# Patient Record
Sex: Female | Born: 1945 | Race: White | Hispanic: No | Smoking: Never smoker
Health system: Southern US, Community
[De-identification: ages and names within clinical notes are randomized; demographics above are authoritative.]

## PROBLEM LIST (undated history)

## (undated) DIAGNOSIS — I10 Essential (primary) hypertension: Secondary | ICD-10-CM

## (undated) DIAGNOSIS — E7800 Pure hypercholesterolemia, unspecified: Secondary | ICD-10-CM

## (undated) HISTORY — PX: APPENDECTOMY: SHX54

## (undated) HISTORY — PX: TONSILLECTOMY: SUR1361

## (undated) HISTORY — PX: CATARACT EXTRACTION: SUR2

---

## 2013-11-25 DIAGNOSIS — G629 Polyneuropathy, unspecified: Secondary | ICD-10-CM | POA: Insufficient documentation

## 2013-11-25 HISTORY — DX: Polyneuropathy, unspecified: G62.9

## 2014-05-02 DIAGNOSIS — E782 Mixed hyperlipidemia: Secondary | ICD-10-CM

## 2014-05-02 DIAGNOSIS — J309 Allergic rhinitis, unspecified: Secondary | ICD-10-CM | POA: Insufficient documentation

## 2014-05-02 DIAGNOSIS — I10 Essential (primary) hypertension: Secondary | ICD-10-CM | POA: Insufficient documentation

## 2014-05-02 HISTORY — DX: Essential (primary) hypertension: I10

## 2014-05-02 HISTORY — DX: Allergic rhinitis, unspecified: J30.9

## 2014-05-02 HISTORY — DX: Mixed hyperlipidemia: E78.2

## 2014-05-03 DIAGNOSIS — R7303 Prediabetes: Secondary | ICD-10-CM

## 2014-05-03 DIAGNOSIS — G629 Polyneuropathy, unspecified: Secondary | ICD-10-CM

## 2014-05-03 HISTORY — DX: Polyneuropathy, unspecified: G62.9

## 2014-05-03 HISTORY — DX: Prediabetes: R73.03

## 2014-08-15 DIAGNOSIS — M8580 Other specified disorders of bone density and structure, unspecified site: Secondary | ICD-10-CM

## 2014-08-15 DIAGNOSIS — M159 Polyosteoarthritis, unspecified: Secondary | ICD-10-CM

## 2014-08-15 DIAGNOSIS — G3184 Mild cognitive impairment, so stated: Secondary | ICD-10-CM | POA: Insufficient documentation

## 2014-08-15 DIAGNOSIS — N951 Menopausal and female climacteric states: Secondary | ICD-10-CM

## 2014-08-15 HISTORY — DX: Other specified disorders of bone density and structure, unspecified site: M85.80

## 2014-08-15 HISTORY — DX: Menopausal and female climacteric states: N95.1

## 2014-08-15 HISTORY — DX: Polyosteoarthritis, unspecified: M15.9

## 2014-08-15 HISTORY — DX: Mild cognitive impairment of uncertain or unknown etiology: G31.84

## 2017-12-09 DIAGNOSIS — H2512 Age-related nuclear cataract, left eye: Secondary | ICD-10-CM | POA: Insufficient documentation

## 2017-12-09 DIAGNOSIS — H2510 Age-related nuclear cataract, unspecified eye: Secondary | ICD-10-CM | POA: Insufficient documentation

## 2017-12-09 HISTORY — DX: Age-related nuclear cataract, left eye: H25.12

## 2018-02-14 ENCOUNTER — Emergency Department (HOSPITAL_BASED_OUTPATIENT_CLINIC_OR_DEPARTMENT_OTHER)
Admission: EM | Admit: 2018-02-14 | Discharge: 2018-02-14 | Disposition: A | Discharge status: Home / Self Care | Payer: Medicare Other | Attending: Emergency Medicine | Admitting: Emergency Medicine

## 2018-02-14 ENCOUNTER — Emergency Department (HOSPITAL_BASED_OUTPATIENT_CLINIC_OR_DEPARTMENT_OTHER): Payer: Medicare Other

## 2018-02-14 ENCOUNTER — Encounter (HOSPITAL_BASED_OUTPATIENT_CLINIC_OR_DEPARTMENT_OTHER): Payer: Self-pay | Admitting: Emergency Medicine

## 2018-02-14 ENCOUNTER — Other Ambulatory Visit: Payer: Self-pay

## 2018-02-14 DIAGNOSIS — Z79899 Other long term (current) drug therapy: Secondary | ICD-10-CM | POA: Diagnosis not present

## 2018-02-14 DIAGNOSIS — I10 Essential (primary) hypertension: Secondary | ICD-10-CM | POA: Diagnosis not present

## 2018-02-14 DIAGNOSIS — M25512 Pain in left shoulder: Secondary | ICD-10-CM | POA: Insufficient documentation

## 2018-02-14 HISTORY — DX: Pure hypercholesterolemia, unspecified: E78.00

## 2018-02-14 HISTORY — DX: Essential (primary) hypertension: I10

## 2018-02-14 MED ORDER — NAPROXEN 250 MG PO TABS
250.0000 mg | ORAL_TABLET | Freq: Once | ORAL | Status: AC
  Administered 2018-02-14: 250 mg via ORAL
  Filled 2018-02-14: qty 1

## 2018-02-14 MED ORDER — HYDROCODONE-ACETAMINOPHEN 5-325 MG PO TABS: 1.0000 | ORAL_TABLET | ORAL | 0 refills | Status: DC | PRN

## 2018-02-14 MED ORDER — LIDOCAINE 5 % EX PTCH: 1.0000 | MEDICATED_PATCH | CUTANEOUS | 0 refills | Status: DC

## 2018-02-14 MED ORDER — HYDROCODONE-ACETAMINOPHEN 5-325 MG PO TABS
1.0000 | ORAL_TABLET | Freq: Once | ORAL | Status: AC
  Administered 2018-02-14: 1 via ORAL
  Filled 2018-02-14: qty 1

## 2018-02-14 MED ORDER — TRAMADOL HCL 50 MG PO TABS: 50.0000 mg | ORAL_TABLET | Freq: Once | ORAL | Status: DC

## 2018-02-14 NOTE — ED Provider Notes (Signed)
[facility] EMERGENCY DEPARTMENT Provider Note   CSN: [id number] Arrival date & time: 02/14/18  1441     History   Chief Complaint Chief Complaint  Patient presents with  . Shoulder Injury    HPI Christie Day is a 72 y.o. female.  HPI 72 year old female with past medical history significant for high cholesterol hypertension presents to the ED with pain at the left shoulder.  Patient states that she fell on her left shoulder several months ago and had imaging at that time that was normal.  Told that she probably has a rotator cuff problem.  The patient states that she has not followed up with orthopedic doctor.  States that she was improved however today she went to reach overhead to put a crockpot in the cabinet and had immediate pain in her left shoulder that almost made her drop the crockpot.  Patient states that since then she has had difficulty lifting her left arm over her shoulder.  This caused her severe pain.  States the pain is throbbing in nature.  Pain is worse with range of motion and palpation.  Nothing makes the pain better.  She did not take anything for the pain prior to arrival.  Denies any associated chest pain or shortness of breath.  Denies any associated paresthesias or weakness. Past Medical History:  Diagnosis Date  . High cholesterol   . Hypertension     There are no active problems to display for this patient.   Past Surgical History:  Procedure Laterality Date  . CATARACT EXTRACTION       OB History   None      Home Medications    Prior to Admission medications   Medication Sig Start Date End Date Taking? Authorizing Provider  amLODipine (NORVASC) 5 MG tablet Take 5 mg by mouth daily.   Yes [provider]  DULoxetine (CYMBALTA) 20 MG capsule Take 30 mg by mouth daily.    Yes [provider]  Multiple Vitamins-Minerals (MULTIVITAMIN WITH MINERALS) tablet Take 1 tablet by mouth daily.   Yes [provider]   PRESCRIPTION MEDICATION    Yes [provider]  vitamin E (VITAMIN E) 1000 UNIT capsule Take 1,000 Units by mouth daily.   Yes [provider]  HYDROcodone-acetaminophen (NORCO/VICODIN) 5-325 MG tablet Take 1-2 tablets by mouth every 4 (four) hours as needed. 02/14/18   Demetrios Loll T, PA-C  lidocaine (LIDODERM) 5 % Place 1 patch onto the skin daily. Remove & Discard patch within 12 hours or as directed by MD 02/14/18   Rise Mu, PA-C    Family History No family history on file.  Social History Social History   Tobacco Use  . Smoking status: Never Smoker  . Smokeless tobacco: Never Used  Substance Use Topics  . Alcohol use: Never    Frequency: Never  . Drug use: Never     Allergies   Sulfa antibiotics   Review of Systems Review of Systems  All other systems reviewed and are negative.    Physical Exam Updated Vital Signs BP (!) 146/75 (BP Location: Left Arm)   Pulse 72   Temp 98.4 F (36.9 C) (Oral)   Resp 18   Ht 5\' 6"  (1.676 m)   Wt 68 kg (150 lb)   SpO2 99%   BMI 24.21 kg/m   Physical Exam  Constitutional: She appears well-developed and well-nourished. No distress.  HENT:  Head: Normocephalic and atraumatic.  Eyes: Right eye exhibits  no discharge. Left eye exhibits no discharge. No scleral icterus.  Neck: Normal range of motion.  Pulmonary/Chest: No respiratory distress.  Musculoskeletal:       Left shoulder: She exhibits decreased range of motion, tenderness, bony tenderness and pain. She exhibits no swelling, no effusion, no crepitus, no deformity, no laceration, no spasm, normal pulse and normal strength.  Pain with palpation over the glenohumeral head.  Patient has a positive Neer's test.  Patient unable to tolerate Hawkins test due to the pain.  Full range of motion left elbow without pain.  Radial pulses are 2+ bilaterally.  Sensation intact.  Brisk cap refill.  Patient's not had pain over the bicep tendon groove.  Skin  compartments are soft.  The pain with palpation radiates to the left upper trapezius.  Neurological: She is alert.  Skin: No pallor.  Psychiatric: Her behavior is normal. Judgment and thought content normal.  Nursing note and vitals reviewed.    ED Treatments / Results  Labs (all labs ordered are listed, but only abnormal results are displayed) Labs Reviewed - No data to display  EKG None  Radiology Dg Shoulder Left  Result Date: 02/14/2018 CLINICAL DATA:  Left shoulder pain after overhead lifting today. Left shoulder injury in December 2018. EXAM: LEFT SHOULDER - 2+ VIEW COMPARISON:  None. FINDINGS: Mild inferior glenohumeral spur formation. Otherwise, normal appearing bones and soft tissues. IMPRESSION: Mild glenohumeral degenerative change.  No acute abnormality. Electronically Signed   By: Beckie Salts M.D.   On: 02/14/2018 15:19    Procedures Procedures (including critical care time)  Medications Ordered in ED Medications  naproxen (NAPROSYN) tablet 250 mg (has no administration in time range)  HYDROcodone-acetaminophen (NORCO/VICODIN) 5-325 MG per tablet 1 tablet (has no administration in time range)     Initial Impression / Assessment and Plan / ED Course  I have reviewed the triage vital signs and the nursing notes.  Pertinent labs & imaging results that were available during my care of the patient were reviewed by me and considered in my medical decision making (see chart for details).     Patient X-Ray negative for obvious fracture or dislocation.  Known rotator cuff abnormality left shoulder.  Acutely worsened with overhead lifting today.  Neurovascularly intact.  Pain managed in ED. Pt advised to follow up with orthopedics if symptoms persist for possibility of missed fracture diagnosis. Patient given brace while in ED, conservative therapy recommended and discussed. Patient will be dc home & is agreeable with above plan.   Pt is hemodynamically stable, in NAD, &  able to ambulate in the ED. Evaluation does not show pathology that would require ongoing emergent intervention or inpatient treatment. I explained the diagnosis to the patient. Pain has been managed & has no complaints prior to dc. Pt is comfortable with above plan and is stable for discharge at this time. All questions were answered prior to disposition. Strict return precautions for f/u to the ED were discussed. Encouraged follow up with PCP.  Pt seen and eval by my attending who is agreeable with the above plan.   Final Clinical Impressions(s) / ED Diagnoses   Final diagnoses:  Acute pain of left shoulder    ED Discharge Orders        Ordered    HYDROcodone-acetaminophen (NORCO/VICODIN) 5-325 MG tablet  Every 4 hours PRN     02/14/18 1538    lidocaine (LIDODERM) 5 %  Every 24 hours     02/14/18 1538  Rise MuLeaphart, Roniesha Hollingshead T, PA-C 02/14/18 1546    Maia PlanLong, Joshua G, MD 02/15/18 1147

## 2018-02-14 NOTE — ED Triage Notes (Addendum)
L shoulder pain after lifting arm above head to put something on a shelf. Unable to raise arm without pain.

## 2018-02-14 NOTE — Discharge Instructions (Addendum)
Your symptoms seem consistent with likely a rotator cuff abnormality.  I would recommend follow-up with an orthopedic doctor.  Have given you a short course of pain medication.  This medication make you drowsy so do not drive with it.  Recommend taking over-the-counter anti-inflammatories such as improving her Motrin or Aleve.  Return to ED if any worsening symptoms.  Would perform the stretches as discussed and avoid keeping her arm immobilized for long periods of time.

## 2018-02-16 ENCOUNTER — Ambulatory Visit: Payer: Self-pay

## 2018-02-16 ENCOUNTER — Ambulatory Visit: Payer: Medicare Other | Admitting: Family Medicine

## 2018-02-16 ENCOUNTER — Encounter: Payer: Self-pay | Admitting: Family Medicine

## 2018-02-16 VITALS — BP 132/74 | HR 72 | Ht 66.0 in | Wt 150.0 lb

## 2018-02-16 DIAGNOSIS — S4992XA Unspecified injury of left shoulder and upper arm, initial encounter: Secondary | ICD-10-CM | POA: Diagnosis not present

## 2018-02-16 DIAGNOSIS — M25512 Pain in left shoulder: Secondary | ICD-10-CM | POA: Diagnosis not present

## 2018-02-16 HISTORY — DX: Unspecified injury of left shoulder and upper arm, initial encounter: S49.92XA

## 2018-02-16 NOTE — Progress Notes (Signed)
PCP: Cheral Bay, MD  Subjective:   HPI: Patient is a 72 y.o. female here for left shoulder pain.  Patient reports back in December she slipped on ice and fell directly onto her left shoulder and hip. She improved but her shoulder never completely recovered. Then she was putting the crackpot up on 3/23 and it started to fall, she caught it with left hand and pushed it forcefully back up causing sharp pain in lateral left shoulder. Difficulty with motion since this. Pain level 5-6/10 and sharp. Pain worse at night. Tried ice/heat, norco. Taking aleve twice a day instead of her meloxicam. Right handed. No skin changes, numbness.  Past Medical History:  Diagnosis Date  . High cholesterol   . Hypertension     Current Outpatient Medications on File Prior to Visit  Medication Sig Dispense Refill  . DULoxetine (CYMBALTA) 30 MG capsule Take by mouth.    . pravastatin (PRAVACHOL) 20 MG tablet Take by mouth.    Marland Kitchen amLODipine (NORVASC) 5 MG tablet Take 5 mg by mouth daily.    Marland Kitchen HYDROcodone-acetaminophen (NORCO/VICODIN) 5-325 MG tablet Take 1-2 tablets by mouth every 4 (four) hours as needed. 8 tablet 0  . meloxicam (MOBIC) 15 MG tablet     . Multiple Vitamins-Minerals (MULTIVITAMIN WITH MINERALS) tablet Take 1 tablet by mouth daily.    . vitamin E (VITAMIN E) 1000 UNIT capsule Take 1,000 Units by mouth daily.     No current facility-administered medications on file prior to visit.     Past Surgical History:  Procedure Laterality Date  . CATARACT EXTRACTION      Allergies  Allergen Reactions  . Lisinopril Other (See Comments)    cough cough   . Sulfa Antibiotics     Social History   Socioeconomic History  . Marital status: Widowed    Spouse name: Not on file  . Number of children: Not on file  . Years of education: Not on file  . Highest education level: Not on file  Occupational History  . Not on file  Social Needs  . Financial resource strain: Not on file  .  Food insecurity:    Worry: Not on file    Inability: Not on file  . Transportation needs:    Medical: Not on file    Non-medical: Not on file  Tobacco Use  . Smoking status: Never Smoker  . Smokeless tobacco: Never Used  Substance and Sexual Activity  . Alcohol use: Never    Frequency: Never  . Drug use: Never  . Sexual activity: Not on file  Lifestyle  . Physical activity:    Days per week: Not on file    Minutes per session: Not on file  . Stress: Not on file  Relationships  . Social connections:    Talks on phone: Not on file    Gets together: Not on file    Attends religious service: Not on file    Active member of club or organization: Not on file    Attends meetings of clubs or organizations: Not on file    Relationship status: Not on file  . Intimate partner violence:    Fear of current or ex partner: Not on file    Emotionally abused: Not on file    Physically abused: Not on file    Forced sexual activity: Not on file  Other Topics Concern  . Not on file  Social History Narrative  . Not on file  History reviewed. No pertinent family history.  BP 132/74   Pulse 72   Ht 5\' 6"  (1.676 m)   Wt 150 lb (68 kg)   BMI 24.21 kg/m   Review of Systems: See HPI above.     Objective:  Physical Exam:  Gen: NAD, comfortable in exam room  Left shoulder: No swelling, ecchymoses.  No gross deformity. TTP lateral to acromion.  No other tenderness. Full IR and ER.  Only 30 degrees abduction and flexion. Positive Hawkins, Neers. Negative Yergasons. Cannot position for empty can.  Some strength with drop arm but very painful. Strength 4/5 IR, 5/5 ER. NV intact distally.  Right shoulder: No swelling, ecchymoses.  No gross deformity. No TTP. FROM. Strength 5/5 with empty can and resisted internal/external rotation. NV intact distally.   MSK u/s Left shoulder:  Biceps tendon intact on long and trans views.  Subscapularis not visible but fluid seen in its place  at insertion.  Infraspinatus is normal.  Supraspinatus with two partial tears without retraction and mild subacromial bursitis.  Assessment & Plan:  1. Left shoulder injury - independently reviewed radiographs and no evidence fracture.  Performed and reviewed ultrasound and concerning for subscapularis full thickness tear and partial tears of supraspinatus.  Will go ahead with MRI to confirm, further assess. Sling with motion exercises.  Aleve, icing.

## 2018-02-16 NOTE — Assessment & Plan Note (Signed)
independently reviewed radiographs and no evidence fracture.  Performed and reviewed ultrasound and concerning for subscapularis full thickness tear and partial tears of supraspinatus.  Will go ahead with MRI to confirm, further assess. Sling with motion exercises.  Aleve, icing.

## 2018-02-16 NOTE — Patient Instructions (Signed)
You appear to have partial tears of your supraspinatus and a full thickness tear of your subscapularis (both rotator cuff muscles). Wear your sling except twice a day when you're doing motion exercises. Arm circles, swings, table slides 3 sets of 10 once or twice a day. Aleve 2 tabs twice a day with food. Icing 15 minutes at a time 3-4 times a day. I'll call you with your MRI results and next steps.

## 2018-02-23 ENCOUNTER — Ambulatory Visit (INDEPENDENT_AMBULATORY_CARE_PROVIDER_SITE_OTHER): Payer: Medicare Other

## 2018-02-23 DIAGNOSIS — W19XXXD Unspecified fall, subsequent encounter: Secondary | ICD-10-CM | POA: Diagnosis not present

## 2018-02-23 DIAGNOSIS — S46812D Strain of other muscles, fascia and tendons at shoulder and upper arm level, left arm, subsequent encounter: Secondary | ICD-10-CM

## 2018-02-23 DIAGNOSIS — M25512 Pain in left shoulder: Secondary | ICD-10-CM

## 2018-02-23 DIAGNOSIS — M19012 Primary osteoarthritis, left shoulder: Secondary | ICD-10-CM

## 2018-02-23 DIAGNOSIS — S46012D Strain of muscle(s) and tendon(s) of the rotator cuff of left shoulder, subsequent encounter: Secondary | ICD-10-CM

## 2018-02-23 DIAGNOSIS — M25412 Effusion, left shoulder: Secondary | ICD-10-CM

## 2018-03-09 NOTE — Addendum Note (Signed)
Addended by: Kathi SimpersWISE, Hisashi Amadon F on: 03/09/2018 01:38 PM   Modules accepted: Orders

## 2019-05-03 DIAGNOSIS — M5136 Other intervertebral disc degeneration, lumbar region: Secondary | ICD-10-CM

## 2019-05-03 DIAGNOSIS — M51369 Other intervertebral disc degeneration, lumbar region without mention of lumbar back pain or lower extremity pain: Secondary | ICD-10-CM | POA: Insufficient documentation

## 2019-05-03 DIAGNOSIS — M4180 Other forms of scoliosis, site unspecified: Secondary | ICD-10-CM

## 2019-05-03 DIAGNOSIS — M4150 Other secondary scoliosis, site unspecified: Secondary | ICD-10-CM

## 2019-05-03 HISTORY — DX: Other secondary scoliosis, site unspecified: M41.50

## 2019-05-03 HISTORY — DX: Other intervertebral disc degeneration, lumbar region without mention of lumbar back pain or lower extremity pain: M51.369

## 2019-05-03 HISTORY — DX: Other forms of scoliosis, site unspecified: M41.80

## 2019-05-03 HISTORY — DX: Other intervertebral disc degeneration, lumbar region: M51.36

## 2019-07-29 DIAGNOSIS — M79671 Pain in right foot: Secondary | ICD-10-CM

## 2019-07-29 DIAGNOSIS — M79672 Pain in left foot: Secondary | ICD-10-CM | POA: Insufficient documentation

## 2019-07-29 DIAGNOSIS — M545 Low back pain, unspecified: Secondary | ICD-10-CM

## 2019-07-29 HISTORY — DX: Pain in left foot: M79.672

## 2019-07-29 HISTORY — DX: Low back pain, unspecified: M54.50

## 2019-07-29 HISTORY — DX: Pain in right foot: M79.671

## 2020-01-03 DIAGNOSIS — M19071 Primary osteoarthritis, right ankle and foot: Secondary | ICD-10-CM

## 2020-01-03 HISTORY — DX: Primary osteoarthritis, right ankle and foot: M19.071

## 2020-02-09 DIAGNOSIS — Z6824 Body mass index (BMI) 24.0-24.9, adult: Secondary | ICD-10-CM | POA: Insufficient documentation

## 2020-02-09 DIAGNOSIS — N3946 Mixed incontinence: Secondary | ICD-10-CM | POA: Insufficient documentation

## 2020-02-09 HISTORY — DX: Mixed incontinence: N39.46

## 2020-02-09 HISTORY — DX: Body mass index (BMI) 24.0-24.9, adult: Z68.24

## 2020-09-12 DIAGNOSIS — R5383 Other fatigue: Secondary | ICD-10-CM

## 2020-09-12 DIAGNOSIS — L237 Allergic contact dermatitis due to plants, except food: Secondary | ICD-10-CM

## 2020-09-12 DIAGNOSIS — R140 Abdominal distension (gaseous): Secondary | ICD-10-CM | POA: Insufficient documentation

## 2020-09-12 HISTORY — DX: Allergic contact dermatitis due to plants, except food: L23.7

## 2020-09-12 HISTORY — DX: Other fatigue: R53.83

## 2020-10-13 ENCOUNTER — Ambulatory Visit: Payer: Medicare Other | Attending: Internal Medicine

## 2020-10-13 ENCOUNTER — Other Ambulatory Visit (HOSPITAL_BASED_OUTPATIENT_CLINIC_OR_DEPARTMENT_OTHER): Payer: Self-pay | Admitting: Internal Medicine

## 2020-10-13 DIAGNOSIS — Z23 Encounter for immunization: Secondary | ICD-10-CM

## 2020-10-13 NOTE — Progress Notes (Signed)
   Covid-19 Vaccination Clinic  Name:  Glennis Borger    MRN: [id number] DOB: [birth year]  10/13/2020  Ms. Drew was observed post Covid-19 immunization for 15 minutes without incident. She was provided with Vaccine Information Sheet and instruction to access the V-Safe system.   Ms. Ra was instructed to call 911 with any severe reactions post vaccine: Marland Kitchen Difficulty breathing  . Swelling of face and throat  . A fast heartbeat  . A bad rash all over body  . Dizziness and weakness   Immunizations Administered    Name Date Dose VIS Date Route   Pfizer COVID-19 Vaccine 10/13/2020 12:32 PM 0.3 mL 09/13/2020 Intramuscular   Manufacturer: [employer], [employer]   Lot: [id number]   NDC: [address]-6

## 2021-03-20 ENCOUNTER — Emergency Department (HOSPITAL_BASED_OUTPATIENT_CLINIC_OR_DEPARTMENT_OTHER): Payer: Medicare Other

## 2021-03-20 ENCOUNTER — Other Ambulatory Visit: Payer: Self-pay

## 2021-03-20 ENCOUNTER — Emergency Department (HOSPITAL_BASED_OUTPATIENT_CLINIC_OR_DEPARTMENT_OTHER)
Admission: EM | Admit: 2021-03-20 | Discharge: 2021-03-20 | Discharge status: Against Medical Advice | Payer: Medicare Other | Attending: Emergency Medicine | Admitting: Emergency Medicine

## 2021-03-20 ENCOUNTER — Encounter (HOSPITAL_BASED_OUTPATIENT_CLINIC_OR_DEPARTMENT_OTHER): Payer: Self-pay

## 2021-03-20 DIAGNOSIS — R55 Syncope and collapse: Secondary | ICD-10-CM | POA: Diagnosis not present

## 2021-03-20 DIAGNOSIS — R002 Palpitations: Secondary | ICD-10-CM | POA: Diagnosis present

## 2021-03-20 DIAGNOSIS — R748 Abnormal levels of other serum enzymes: Secondary | ICD-10-CM | POA: Diagnosis not present

## 2021-03-20 DIAGNOSIS — R778 Other specified abnormalities of plasma proteins: Secondary | ICD-10-CM

## 2021-03-20 DIAGNOSIS — I10 Essential (primary) hypertension: Secondary | ICD-10-CM | POA: Insufficient documentation

## 2021-03-20 DIAGNOSIS — R42 Dizziness and giddiness: Secondary | ICD-10-CM | POA: Insufficient documentation

## 2021-03-20 LAB — COMPREHENSIVE METABOLIC PANEL
ALT: 22 U/L (ref 0–44)
AST: 25 U/L (ref 15–41)
Albumin: 3.8 g/dL (ref 3.5–5.0)
Alkaline Phosphatase: 87 U/L (ref 38–126)
Anion gap: 8 (ref 5–15)
BUN: 19 mg/dL (ref 8–23)
CO2: 27 mmol/L (ref 22–32)
Calcium: 9.1 mg/dL (ref 8.9–10.3)
Chloride: 103 mmol/L (ref 98–111)
Creatinine, Ser: 0.62 mg/dL (ref 0.44–1.00)
GFR, Estimated: >60 mL/min (ref >60)
Glucose, Bld: 100 mg/dL — ABNORMAL HIGH (ref 70–99)
Potassium: 3.7 mmol/L (ref 3.5–5.1)
Sodium: 138 mmol/L (ref 135–145)
Total Bilirubin: 0.4 mg/dL (ref 0.3–1.2)
Total Protein: 6.6 g/dL (ref 6.5–8.1)

## 2021-03-20 LAB — LIPASE, BLOOD: Lipase: 37 U/L (ref 11–51)

## 2021-03-20 LAB — CBC WITH DIFFERENTIAL/PLATELET
Abs Immature Granulocytes: 0.03 10*3/uL (ref 0.00–0.07)
Basophils Absolute: 0.1 10*3/uL (ref 0.0–0.1)
Basophils Relative: 2 %
Eosinophils Absolute: 0.3 10*3/uL (ref 0.0–0.5)
Eosinophils Relative: 3 %
HCT: 43.4 % (ref 36.0–46.0)
Hemoglobin: 14.3 g/dL (ref 12.0–15.0)
Immature Granulocytes: 0 %
Lymphocytes Relative: 33 %
Lymphs Abs: 2.9 10*3/uL (ref 0.7–4.0)
MCH: 30 pg (ref 26.0–34.0)
MCHC: 32.9 g/dL (ref 30.0–36.0)
MCV: 91 fL (ref 80.0–100.0)
Monocytes Absolute: 0.8 10*3/uL (ref 0.1–1.0)
Monocytes Relative: 9 %
Neutro Abs: 4.7 10*3/uL (ref 1.7–7.7)
Neutrophils Relative %: 53 %
Platelets: 207 10*3/uL (ref 150–400)
RBC: 4.77 MIL/uL (ref 3.87–5.11)
RDW: 13.1 % (ref 11.5–15.5)
WBC: 8.8 10*3/uL (ref 4.0–10.5)
nRBC: 0 % (ref 0.0–0.2)

## 2021-03-20 LAB — TROPONIN I (HIGH SENSITIVITY)
Troponin I (High Sensitivity): 14 ng/L (ref <18)
Troponin I (High Sensitivity): 26 ng/L — ABNORMAL HIGH (ref <18)
Troponin I (High Sensitivity): 44 ng/L — ABNORMAL HIGH (ref <18)

## 2021-03-20 MED ORDER — SODIUM CHLORIDE 0.9 % IV BOLUS
500.0000 mL | Freq: Once | INTRAVENOUS | Status: AC
  Administered 2021-03-20: 500 mL via INTRAVENOUS

## 2021-03-20 NOTE — ED Provider Notes (Signed)
Emergency Department Provider Note   I have reviewed the triage vital signs and the nursing notes.   HISTORY  Chief Complaint Dizziness and Palpitations (And dizziness)   HPI Christie Day is a 75 y.o. female with PMH of HTN and HLD presents to the ED with heart palpitations and lightheadedness starting this AM. The patient was out and working in the yard today for about 30 min when she became lightheaded and felt like her heart was racing. She denies pain/pressure in the chest. No full syncope. She does note that it was getting hot outside but was not working particularly hard. She worked outside in the heat most of the day yesterday but was drinking fluids and taking frequent breaks. She has had palpitations once before but the episode was brief and she did not seek care. No abdominal pain, vomiting, or diarrhea. She alerted a neighbor that she was feeling bad and ultimately called EMS. They gave 500 ml IVF en route and patient is no longer having palpitations and notes symptoms are improved.    Past Medical History:  Diagnosis Date  . Allergic rhinitis 05/02/2014  . Benign essential hypertension 05/02/2014  . BMI 24.0-24.9, adult 02/09/2020   Formatting of this note might be different from the original. Discussed normal BMI in patient - cont to do regular exercise  . Degenerative disc disease, lumbar 05/03/2019   Formatting of this note might be different from the original. Chronic, stable - cont cymbalta and gabapentin  . 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 Essential hypertension 05/02/2014   Formatting of this note might be different from the original. Chronic, well controlled, goal < 150/90 as per JNC 8, however ideally < 140/90 - cont amlodipine  . Foot pain, bilateral 07/29/2019  . High cholesterol   . Hypertension   . Low back pain 07/29/2019  . Mild cognitive impairment 08/15/2014  . Mixed hyperlipidemia 05/02/2014   Last Assessment & Plan:  Formatting of this note  might be different from the original. Chronic, stable, did not take statin - discussed ASCVD risk - patient will work on limiting saturated fats but wants repeat lipids today  . 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 Chronic, uncontrolled with conservative measures and ditropan - patient would like referral - we will start with urogynecology  . Neuropathy 2015  . Nuclear sclerotic cataract of left eye 12/09/2017  . 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 Osteoarthrosis, generalized, involving multiple sites 08/15/2014  . Osteopenia 08/15/2014  . Other fatigue 09/12/2020   Last Assessment & Plan:  Formatting of this note might be different from the original. Chronic, intermittent, no rhyme or reason - will check vitamins  . Peripheral neuropathy 05/03/2014  . Poison oak dermatitis 09/12/2020   Last Assessment & Plan:  Formatting of this note might be different from the original. Acute, uncontrolled - topical high dose clobetasol  . Prediabetes 05/03/2014  . Shoulder injury, left, initial encounter 02/16/2018  . Symptomatic menopausal or female climacteric states 08/15/2014    Patient Active Problem List   Diagnosis Date Noted  . High cholesterol   . Abdominal bloating 09/12/2020  . Other fatigue 09/12/2020  . Poison oak dermatitis 09/12/2020  . BMI 24.0-24.9, adult 02/09/2020  . 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 Foot pain, bilateral 07/29/2019  . Low back pain  07/29/2019  . Degenerative disc disease, lumbar 05/03/2019  . 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 Shoulder injury, left, initial encounter 02/16/2018  . Nuclear sclerotic cataract of left eye 12/09/2017  . Osteoarthrosis, generalized, involving multiple sites 08/15/2014  . Mild cognitive impairment 08/15/2014  . Symptomatic menopausal or female  climacteric states 08/15/2014  . Prediabetes 05/03/2014  . Peripheral neuropathy 05/03/2014  . Mixed hyperlipidemia 05/02/2014  . Benign essential hypertension 05/02/2014  . Allergic rhinitis 05/02/2014  . Essential hypertension 05/02/2014  . Neuropathy 2015    Past Surgical History:  Procedure Laterality Date  . APPENDECTOMY    . CATARACT EXTRACTION    . TONSILLECTOMY      Allergies Lisinopril and Sulfa antibiotics  No family history on file.  Social History Social History   Tobacco Use  . Smoking status: Never Smoker  . Smokeless tobacco: Never Used  Substance Use Topics  . Alcohol use: Yes    Comment: social     Review of Systems  Constitutional: No fever/chills Eyes: No visual changes. ENT: No sore throat. Cardiovascular: Denies chest pain. Positive palpitations and near syncope.   Respiratory: Denies shortness of breath. Gastrointestinal: No abdominal pain.  No nausea, no vomiting.  No diarrhea.  No constipation. Genitourinary: Negative for dysuria. Musculoskeletal: Negative for back pain. Skin: Negative for rash. Neurological: Negative for headaches, focal weakness or numbness.  10-point ROS otherwise negative.  ____________________________________________   PHYSICAL EXAM:  VITAL SIGNS: ED Triage Vitals  Enc Vitals Group     BP 03/20/21 1313 93/64     Pulse Rate 03/20/21 1309 87     Resp 03/20/21 1309 19     Temp 03/20/21 1313 98.4 F (36.9 C)     Temp Source 03/20/21 1313 Oral     SpO2 03/20/21 1309 96 %   Constitutional: Alert and oriented. Well appearing and in no acute distress. Eyes: Conjunctivae are normal.  Head: Atraumatic. Nose: No congestion/rhinnorhea. Mouth/Throat: Mucous membranes are moist.  Neck: No stridor.   Cardiovascular: Normal rate, regular rhythm. Good peripheral circulation. Grossly normal heart sounds.   Respiratory: Normal respiratory effort.  No retractions. Lungs CTAB. Gastrointestinal: Soft and nontender. No  distention.  Musculoskeletal: No lower extremity tenderness nor edema. No gross deformities of extremities. Neurologic:  Normal speech and language. No gross focal neurologic deficits are appreciated.  Skin:  Skin is warm, dry and intact. No rash noted.  ____________________________________________   LABS (all labs ordered are listed, but only abnormal results are displayed)  Labs Reviewed  COMPREHENSIVE METABOLIC PANEL - Abnormal; Notable for the following components:      Result Value   Glucose, Bld 100 (*)    All other components within normal limits  TROPONIN I (HIGH SENSITIVITY) - Abnormal; Notable for the following components:   Troponin I (High Sensitivity) 26 (*)    All other components within normal limits  TROPONIN I (HIGH SENSITIVITY) - Abnormal; Notable for the following components:   Troponin I (High Sensitivity) 44 (*)    All other components within normal limits  LIPASE, BLOOD  CBC WITH DIFFERENTIAL/PLATELET  TROPONIN I (HIGH SENSITIVITY)   ____________________________________________  EKG   EKG Interpretation  Date/Time:  Tuesday March 20 2021 13:09:17 EDT Ventricular Rate:  87 PR Interval:  163 QRS Duration: 92 QT Interval:  358 QTC Calculation: 431 R Axis:   35 Text Interpretation: Sinus rhythm Consider left atrial enlargement Low voltage, precordial leads RSR' in V1 or V2, probably normal variant No old tracing  to compare Confirmed by Alona Bene [phone]) on 03/20/2021 1:13:31 PM       ____________________________________________  RADIOLOGY  CXR reviewed  ____________________________________________   PROCEDURES  Procedure(s) performed:   Procedures  None ____________________________________________   INITIAL IMPRESSION / ASSESSMENT AND PLAN / ED COURSE  Pertinent labs & imaging results that were available during my care of the patient were reviewed by me and considered in my medical decision making (see chart for details).   Patient  presents to the emergency department valuation heart palpitations and near syncope while working in the yard today.  She is feeling improved after some IV fluids.  She is afebrile with normal rate and sinus rhythm on EKG.  EKG interpreted by me as above.  Her blood pressures on arrival are somewhat soft with systolic pressures in the 90s.  It is warm outside today but not overly hot.  As she is awake and alert.  I do not appreciate any focal neurologic deficits or global encephalopathy type presentation.   Labs pending. Care transferred to Dr. Stevie Kern.  ____________________________________________  FINAL CLINICAL IMPRESSION(S) / ED DIAGNOSES  Final diagnoses:  Palpitations  Near syncope  Elevated troponin     MEDICATIONS GIVEN DURING THIS VISIT:  Medications  sodium chloride 0.9 % bolus 500 mL (0 mLs Intravenous Stopped 03/20/21 1528)     Note:  This document was prepared using Dragon voice recognition software and may include unintentional dictation errors.  Alona Bene, MD, [facility] Emergency Medicine    Sharel Behne, Arlyss Repress, MD 03/23/21 [phone]

## 2021-03-20 NOTE — Discharge Instructions (Addendum)
Your troponin level was mildly elevated.  We recommended admission for further management.  You may return at anytime if you change your mind, or if you have any recurrence of your symptoms.  I have placed a referral in our system to the Cardiology team. Call tomorrow to confirm your appointment.   Please call your regular doctor as soon as possible to schedule the next available clinic appointment to follow up with him/her regarding your visit to the ED and your symptoms.  Return to the Emergency Department (ED)  if you have any further syncopal episodes (pass out again) or develop ANY chest pain, pressure, tightness, trouble breathing, sudden sweating, or other symptoms that concern you.

## 2021-03-20 NOTE — ED Triage Notes (Signed)
Arrived ems w c/o dizziness, hot, clammy while being outside

## 2021-03-20 NOTE — ED Triage Notes (Signed)
Pt was working in yard this am, became very dizzy and heart racing.  Pt called EMS and was transported to ED for evaluation

## 2021-03-20 NOTE — ED Provider Notes (Signed)
Signout note  Patient had an episode of lightheadedness and palpitations while working in the yard this morning.  Initial work-up was overall reassuring.  Second troponin ordered to rule out ACS.  3:20 PM Received signout from Dr. Jacqulyn Bath, follow-up on repeat troponin, anticipate discharge  4:30 PM Repeat troponin was slightly elevated at 26, reassessed patient, she has no ongoing symptoms, will send a third troponin to further evaluate and continue to monitor patient on monitor  6:13 PM 3rd trop 44, will discuss with cards  6:48 PM I discussed case with cardiology, Dr. Mayford Knife, feels unlikely ACS but raises concern for possible arrhythmia given this mild elevation and palpitations/near syncope, she recommended recommended admission to the hospitalist service for further observation, telemetry monitoring overnight; I reassessed patient, discussed the abnormal blood work and the cardiology recommendation for admission.  Patient states that she has no ongoing symptoms and wishes to pursue outpatient management at this time.  I discussed the risks and benefits in detail, she acknowledged these and has chose to leave AGAINST MEDICAL ADVICE.   Milagros Loll, MD 03/20/21 [phone]

## 2021-03-21 ENCOUNTER — Other Ambulatory Visit: Payer: Self-pay

## 2021-03-21 DIAGNOSIS — M159 Polyosteoarthritis, unspecified: Secondary | ICD-10-CM

## 2021-03-21 DIAGNOSIS — N951 Menopausal and female climacteric states: Secondary | ICD-10-CM

## 2021-03-21 DIAGNOSIS — E7800 Pure hypercholesterolemia, unspecified: Secondary | ICD-10-CM | POA: Insufficient documentation

## 2021-03-23 ENCOUNTER — Ambulatory Visit: Payer: Medicare Other | Admitting: Cardiology

## 2021-03-23 ENCOUNTER — Encounter: Payer: Self-pay | Admitting: Cardiology

## 2021-03-23 ENCOUNTER — Other Ambulatory Visit: Payer: Self-pay

## 2021-03-23 VITALS — BP 112/80 | HR 69 | Ht 66.0 in | Wt 147.0 lb

## 2021-03-23 DIAGNOSIS — R7303 Prediabetes: Secondary | ICD-10-CM

## 2021-03-23 DIAGNOSIS — I10 Essential (primary) hypertension: Secondary | ICD-10-CM | POA: Diagnosis not present

## 2021-03-23 DIAGNOSIS — E7800 Pure hypercholesterolemia, unspecified: Secondary | ICD-10-CM | POA: Diagnosis not present

## 2021-03-23 DIAGNOSIS — R0789 Other chest pain: Secondary | ICD-10-CM

## 2021-03-23 DIAGNOSIS — R079 Chest pain, unspecified: Secondary | ICD-10-CM

## 2021-03-23 MED ORDER — NITROGLYCERIN 0.4 MG SL SUBL: 0.4000 mg | SUBLINGUAL_TABLET | SUBLINGUAL | 11 refills | Status: AC | PRN

## 2021-03-23 MED ORDER — METOPROLOL TARTRATE 100 MG PO TABS: 100.0000 mg | ORAL_TABLET | Freq: Once | ORAL | 0 refills | Status: AC

## 2021-03-23 MED ORDER — ATORVASTATIN CALCIUM 10 MG PO TABS: 10.0000 mg | ORAL_TABLET | Freq: Every day | ORAL | 1 refills | Status: DC

## 2021-03-23 NOTE — Progress Notes (Signed)
Cardiology Consultation:    Date:  03/23/2021   ID:  Christie Day, DOB 01/26/1946, MRN [id number]  PCP:  Herma Carson, MD  Cardiologist:  Gypsy Balsam, MD   Referring MD: Maia Plan, MD   Chief Complaint  Patient presents with  . Palpitations  . Dehydration  . Dizziness    History of Present Illness:    Christie Day is a 75 y.o. female who is being seen today for the evaluation of I had dizziness palpitations and some atypical chest pain at the request of Long, Arlyss Repress, MD.  She does have history of hypertension, dyslipidemia, borderline diabetes.  She is very active she exercise on the regular basis she can walk and climb stairs with no difficulties.  Couple days ago she was washing hands with her neighbor that was in the middle of the day it was quite hot day after that she started feeling poorly dizzy started having some pounding in the chest and also some uneasy sensation of the chest eventually not going home call her friend who is a Engineer, civil (consulting) and she advised her to go to the emergency room.  In the emergency room quite extensive evaluation has been done.  EKG did not show any acute changes however troponin I was minimally elevated with a trend of down.  Highest number of high-sensitivity troponin I was 44.  The troponin was repeated yesterday and was normal.  Since that time she has absolutely no problem she can do whatever she wants to do with no difficulties.  Interestingly she describes similar episodes in November and not as profound.  Again overall she is very active she has no difficulty walking climbing stairs and no problem doing this she goes and exercise on the regular basis with no difficulties. She does not smoke Does not have family history of premature coronary disease but her husband died because of heart attack about 10 years ago. She does exercise on the regular basis with no difficulties  Past Medical History:  Diagnosis Date  . Allergic rhinitis  05/02/2014  . Benign essential hypertension 05/02/2014  . BMI 24.0-24.9, adult 02/09/2020   Formatting of this note might be different from the original. Discussed normal BMI in patient - cont to do regular exercise  . Degenerative disc disease, lumbar 05/03/2019   Formatting of this note might be different from the original. Chronic, stable - cont cymbalta and gabapentin  . 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 Essential hypertension 05/02/2014   Formatting of this note might be different from the original. Chronic, well controlled, goal < 150/90 as per JNC 8, however ideally < 140/90 - cont amlodipine  . Foot pain, bilateral 07/29/2019  . High cholesterol   . Hypertension   . Low back pain 07/29/2019  . Mild cognitive impairment 08/15/2014  . Mixed hyperlipidemia 05/02/2014   Last Assessment & Plan:  Formatting of this note might be different from the original. Chronic, stable, did not take statin - discussed ASCVD risk - patient will work on limiting saturated fats but wants repeat lipids today  . 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 Chronic, uncontrolled with conservative measures and ditropan - patient would like referral - we will start with urogynecology  . Neuropathy 2015  . Nuclear sclerotic cataract of left eye 12/09/2017  . 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 Osteoarthrosis, generalized, involving multiple sites  08/15/2014  . Osteopenia 08/15/2014  . Other fatigue 09/12/2020   Last Assessment & Plan:  Formatting of this note might be different from the original. Chronic, intermittent, no rhyme or reason - will check vitamins  . Peripheral neuropathy 05/03/2014  . Poison oak dermatitis 09/12/2020   Last Assessment & Plan:  Formatting of this note might be different from the original. Acute, uncontrolled - topical high dose clobetasol  . Prediabetes 05/03/2014  . Shoulder  injury, left, initial encounter 02/16/2018  . Symptomatic menopausal or female climacteric states 08/15/2014    Past Surgical History:  Procedure Laterality Date  . APPENDECTOMY    . CATARACT EXTRACTION    . TONSILLECTOMY      Current Medications: Current Meds  Medication Sig  . amLODipine (NORVASC) 5 MG tablet Take 5 mg by mouth daily.  Marland Kitchen atorvastatin (LIPITOR) 10 MG tablet Take 1 tablet (10 mg total) by mouth daily.  . B Complex-C (SUPER B COMPLEX PO) Take 1 tablet by mouth daily. Unknown strength  . Calcium Acetate, Phos Binder, (CALCIUM ACETATE PO) Take 1 tablet by mouth daily. Unknown strength  . cholecalciferol (VITAMIN D3) 25 MCG (1000 UNIT) tablet Take 1,000 Units by mouth daily.  . DULoxetine (CYMBALTA) 30 MG capsule Take 60 mg by mouth daily.  . fexofenadine-pseudoephedrine (ALLEGRA-D) 60-120 MG 12 hr tablet Take 1 tablet by mouth as needed (Seasonal allergies).  . metoprolol tartrate (LOPRESSOR) 100 MG tablet Take 1 tablet (100 mg total) by mouth once for 1 dose. 2 hours before ct.  . Multiple Vitamin (MULTI-VITAMIN) tablet Take 1 tablet by mouth daily. Unknown strength  . naproxen (NAPROSYN) 500 MG tablet Take 1 tablet by mouth as needed (feet arthritis, neuropathy).  . nitroGLYCERIN (NITROSTAT) 0.4 MG SL tablet Place 1 tablet (0.4 mg total) under the tongue every 5 (five) minutes as needed.  Marland Kitchen oxybutynin (DITROPAN-XL) 5 MG 24 hr tablet Take 1 tablet by mouth at bedtime.  . pyridoxine (B-6) 100 MG tablet Take 100 mg by mouth daily.  . Turmeric (QC TUMERIC COMPLEX PO) Take 1 tablet by mouth daily. Unknown strength  . [DISCONTINUED] Multiple Vitamins-Minerals (MULTIVITAMIN WITH MINERALS) tablet Take 1 tablet by mouth daily.     Allergies:   Lisinopril and Sulfa antibiotics   Social History   Socioeconomic History  . Marital status: Widowed    Spouse name: Not on file  . Number of children: Not on file  . Years of education: Not on file  . Highest education level: Not  on file  Occupational History  . Not on file  Tobacco Use  . Smoking status: Never Smoker  . Smokeless tobacco: Never Used  Substance and Sexual Activity  . Alcohol use: Yes    Comment: social   . Drug use: Not on file  . Sexual activity: Not on file  Other Topics Concern  . Not on file  Social History Narrative  . Not on file   Social Determinants of Health   Financial Resource Strain: Not on file  Food Insecurity: Not on file  Transportation Needs: Not on file  Physical Activity: Not on file  Stress: Not on file  Social Connections: Not on file     Family History: The patient's family history includes Asthma in her mother; Diabetes in her mother; Emphysema in her father. ROS:   Please see the history of present illness.    All 14 point review of systems negative except as described per history of present illness.  EKGs/Labs/Other  Studies Reviewed:    The following studies were reviewed today: I did review record from the emergency room  EKG:  EKG is  ordered today.  The ekg ordered today demonstrates normal sinus rhythm normal P interval no ST segment changes  Recent Labs: 03/20/2021: ALT 22; BUN 19; Creatinine, Ser 0.62; Hemoglobin 14.3; Platelets 207; Potassium 3.7; Sodium 138  Recent Lipid Panel No results found for: CHOL, TRIG, HDL, CHOLHDL, VLDL, LDLCALC, LDLDIRECT  Physical Exam:    VS:  BP 112/80 (BP Location: Right Arm, Patient Position: Sitting)   Pulse 69   Ht 5\' 6"  (1.676 m)   Wt 147 lb (66.7 kg)   SpO2 95%   BMI 23.73 kg/m     Wt Readings from Last 3 Encounters:  03/23/21 147 lb (66.7 kg)  03/20/21 145 lb (65.8 kg)  02/16/18 150 lb (68 kg)     GEN:  Well nourished, well developed in no acute distress HEENT: Normal NECK: No JVD; No carotid bruits LYMPHATICS: No lymphadenopathy CARDIAC: RRR, no murmurs, no rubs, no gallops RESPIRATORY:  Clear to auscultation without rales, wheezing or rhonchi  ABDOMEN: Soft, non-tender,  non-distended MUSCULOSKELETAL:  No edema; No deformity  SKIN: Warm and dry NEUROLOGIC:  Alert and oriented x 3 PSYCHIATRIC:  Normal affect   ASSESSMENT:    1. Chest pain of uncertain etiology   2. Benign essential hypertension   3. Prediabetes   4. High cholesterol   5. Atypical chest pain    PLAN:    In order of problems listed above:  1. Chest pain uncertain etiology.  Troponin I minimally elevated however this is high-sensitivity of troponin I.  Could be elevated because of dehydration because of palpitations because of arrhythmia.  Since that time she has absolutely no symptomatology.  Overall she does not have any signs and symptoms suggestive coronary artery disease before.  I still think it would be reasonable to make sure she does not have obstructive disease.  I will schedule her to have coronary CT angio.  I will also ask her to start taking 1 baby aspirin every single day, will also initiate statin therapy and I will give him nitroglycerin.  With instruction to go to the emergency room or call 911 if 3 nitroglycerin does not relieve the pain. 2. Benign essential hypertension blood pressure seems to well controlled continue present management. 3. Prediabetes we will continue discussion about proper diet and need to exercise but first we need to answer the question if she have significant heart problem. 4. Dyslipidemia I asked her to start taking Lipitor 10 mg daily.  Future recommendation in terms of dosages of those medications as well as aggressiveness of treatment will depend on coronary CT angio.    Medication Adjustments/Labs and Tests Ordered: Current medicines are reviewed at length with the patient today.  Concerns regarding medicines are outlined above.  Orders Placed This Encounter  Procedures  . CT CORONARY MORPH W/CTA COR W/SCORE W/CA W/CM &/OR WO/CM  . Basic metabolic panel  . EKG 12-Lead   Meds ordered this encounter  Medications  . nitroGLYCERIN (NITROSTAT)  0.4 MG SL tablet    Sig: Place 1 tablet (0.4 mg total) under the tongue every 5 (five) minutes as needed.    Dispense:  25 tablet    Refill:  11  . atorvastatin (LIPITOR) 10 MG tablet    Sig: Take 1 tablet (10 mg total) by mouth daily.    Dispense:  90 tablet  Refill:  1  . metoprolol tartrate (LOPRESSOR) 100 MG tablet    Sig: Take 1 tablet (100 mg total) by mouth once for 1 dose. 2 hours before ct.    Dispense:  1 tablet    Refill:  0    Signed, Georgeanna Lea, MD, [facility]. 03/23/2021 12:54 PM    [facility]

## 2021-03-23 NOTE — Patient Instructions (Signed)
Medication Instructions:  Your physician has recommended you make the following change in your medication:   TAKE AS NEEDED FOR CHEST PAIN: Nitroglycerin 0.4 mg sublingual (under your tongue) as needed for chest pain. If experiencing chest pain, stop what you are doing and sit down. Take 1 nitroglycerin and wait 5 minutes. If chest pain continues, take another nitroglycerin and wait 5 minutes. If chest pain does not subside, take 1 more nitroglycerin and dial 911. You make take a total of 3 nitroglycerin in a 15 minute time frame.  START: Lipitor 10 mg daily   *If you need a refill on your cardiac medications before your next appointment, please call your pharmacy*   Lab Work: Your physician recommends that you return for lab work 3-7 days before ct : BMP   If you have labs (blood work) drawn today and your tests are completely normal, you will receive your results only by: Marland Kitchen MyChart Message (if you have MyChart) OR . A paper copy in the mail If you have any lab test that is abnormal or we need to change your treatment, we will call you to review the results.   Testing/Procedures:   Your cardiac CT will be scheduled at one of the below locations:   [facility] [address] [place], [place] [address] [phone]  OR  [facility] [address] [place], [place] [address] [phone]  If scheduled at [facility], please arrive at the [facility] main entrance (entrance A) of [facility] 30 minutes prior to test start time. Proceed to the [facility] Radiology Department (first floor) to check-in and test prep.  If scheduled at [facility], please arrive 15 mins early for check-in and test prep.  Please follow these instructions carefully (unless otherwise directed):   On the Night Before the Test: . Be sure to Drink plenty of water. . Do not consume any  caffeinated/decaffeinated beverages or chocolate 12 hours prior to your test. . Do not take any antihistamines 12 hours prior to your test.   On the Day of the Test: . Drink plenty of water until 1 hour prior to the test. . Do not eat any food 4 hours prior to the test. . You may take your regular medications prior to the test.  . Take metoprolol (Lopressor) two hours prior to test. . FEMALES- please wear underwire-free bra if available         After the Test: . Drink plenty of water. . After receiving IV contrast, you may experience a mild flushed feeling. This is normal. . On occasion, you may experience a mild rash up to 24 hours after the test. This is not dangerous. If this occurs, you can take Benadryl 25 mg and increase your fluid intake. . If you experience trouble breathing, this can be serious. If it is severe call 911 IMMEDIATELY. If it is mild, please call our office. . If you take any of these medications: Glipizide/Metformin, Avandament, Glucavance, please do not take 48 hours after completing test unless otherwise instructed.   Once we have confirmed authorization from your insurance company, we will call you to set up a date and time for your test. Based on how quickly your insurance processes prior authorizations requests, please allow up to 4 weeks to be contacted for scheduling your Cardiac CT appointment. Be advised that routine Cardiac CT appointments could be scheduled as many as 8 weeks after  your provider has ordered it.  For non-scheduling related questions, please contact the cardiac imaging nurse navigator should you have any questions/concerns: Rockwell Alexandria, Cardiac Imaging Nurse Navigator Larey Brick, Cardiac Imaging Nurse Navigator [facility] Direct Office Dial: [phone]   For scheduling needs, including cancellations and rescheduling, please call [place], [phone].      Follow-Up: At [facility], you and  your health needs are our priority.  As part of our continuing mission to provide you with exceptional heart care, we have created designated Provider Care Teams.  These Care Teams include your primary Cardiologist (physician) and Advanced Practice Providers (APPs -  Physician Assistants and Nurse Practitioners) who all work together to provide you with the care you need, when you need it.  We recommend signing up for the patient portal called "MyChart".  Sign up information is provided on this After Visit Summary.  MyChart is used to connect with patients for Virtual Visits (Telemedicine).  Patients are able to view lab/test results, encounter notes, upcoming appointments, etc.  Non-urgent messages can be sent to your provider as well.   To learn more about what you can do with MyChart, go to [email or web address].    Your next appointment:   2 month(s)  The format for your next appointment:   In Person  Provider:   Gypsy Balsam, MD   Other Instructions  Nitroglycerin sublingual tablets What is this medicine? NITROGLYCERIN (nye troe GLI ser in) is a type of vasodilator. It relaxes blood vessels, increasing the blood and oxygen supply to your heart. This medicine is used to relieve chest pain caused by angina. It is also used to prevent chest pain before activities like climbing stairs, going outdoors in cold weather, or sexual activity. This medicine may be used for other purposes; ask your health care provider or pharmacist if you have questions. COMMON BRAND NAME(S): Nitroquick, Nitrostat, Nitrotab What should I tell my health care provider before I take this medicine? They need to know if you have any of these conditions:  anemia  head injury, recent stroke, or bleeding in the brain  liver disease  previous heart attack  an unusual or allergic reaction to nitroglycerin, other medicines, foods, dyes, or preservatives  pregnant or trying to get  pregnant  breast-feeding How should I use this medicine? Take this medicine by mouth as needed. Use at the first sign of an angina attack (chest pain or tightness). You can also take this medicine 5 to 10 minutes before an event likely to produce chest pain. Follow the directions exactly as written on the prescription label. Place one tablet under your tongue and let it dissolve. Do not swallow whole. Replace the dose if you accidentally swallow it. It will help if your mouth is not dry. Saliva around the tablet will help it to dissolve more quickly. Do not eat or drink, smoke or chew tobacco while a tablet is dissolving. Sit down when taking this medicine. In an angina attack, you should feel better within 5 minutes after your first dose. You can take a dose every 5 minutes up to a total of 3 doses. If you do not feel better or feel worse after 1 dose, call 9-1-1 at once. Do not take more than 3 doses in 15 minutes. Your health care provider might give you other directions. Follow those directions if he or she does. Do not take your medicine more often than directed. Talk to your health care provider about the  use of this medicine in children. Special care may be needed. Overdosage: If you think you have taken too much of this medicine contact a poison control center or emergency room at once. NOTE: This medicine is only for you. Do not share this medicine with others. What if I miss a dose? This does not apply. This medicine is only used as needed. What may interact with this medicine? Do not take this medicine with any of the following medications:  certain migraine medicines like ergotamine and dihydroergotamine (DHE)  medicines used to treat erectile dysfunction like sildenafil, tadalafil, and vardenafil  riociguat This medicine may also interact with the following medications:  alteplase  aspirin  heparin  medicines for high blood pressure  medicines for mental depression  other  medicines used to treat angina  phenothiazines like chlorpromazine, mesoridazine, prochlorperazine, thioridazine This list may not describe all possible interactions. Give your health care provider a list of all the medicines, herbs, non-prescription drugs, or dietary supplements you use. Also tell them if you smoke, drink alcohol, or use illegal drugs. Some items may interact with your medicine. What should I watch for while using this medicine? Tell your doctor or health care professional if you feel your medicine is no longer working. Keep this medicine with you at all times. Sit or lie down when you take your medicine to prevent falling if you feel dizzy or faint after using it. Try to remain calm. This will help you to feel better faster. If you feel dizzy, take several deep breaths and lie down with your feet propped up, or bend forward with your head resting between your knees. You may get drowsy or dizzy. Do not drive, use machinery, or do anything that needs mental alertness until you know how this drug affects you. Do not stand or sit up quickly, especially if you are an older patient. This reduces the risk of dizzy or fainting spells. Alcohol can make you more drowsy and dizzy. Avoid alcoholic drinks. Do not treat yourself for coughs, colds, or pain while you are taking this medicine without asking your doctor or health care professional for advice. Some ingredients may increase your blood pressure. What side effects may I notice from receiving this medicine? Side effects that you should report to your doctor or health care professional as soon as possible:  allergic reactions (skin rash, itching or hives; swelling of the face, lips, or tongue)  low blood pressure (dizziness; feeling faint or lightheaded, falls; unusually weak or tired)  low red blood cell counts (trouble breathing; feeling faint; lightheaded, falls; unusually weak or tired) Side effects that usually do not require  medical attention (report to your doctor or health care professional if they continue or are bothersome):  facial flushing (redness)  headache  nausea, vomiting This list may not describe all possible side effects. Call your doctor for medical advice about side effects. You may report side effects to FDA at 1-800-FDA-1088. Where should I keep my medicine? Keep out of the reach of children. Store at room temperature between 20 and 25 degrees C (68 and 77 degrees F). Store in Retail buyer. Protect from light and moisture. Keep tightly closed. Throw away any unused medicine after the expiration date. NOTE: This sheet is a summary. It may not cover all possible information. If you have questions about this medicine, talk to your doctor, pharmacist, or health care provider.  2021 Elsevier/Gold Standard (2018-08-12 16:46:32)  Atorvastatin Tablets What is this medicine? ATORVASTATIN (a  TORE va sta tin) is a statin. It lowers bad cholesterol and triglyceride levels in the blood. It also increases good cholesterol levels. It is used with lifestyle changes, like diet and exercise. It may be used alone or with other drugs. This medicine may be used for other purposes; ask your health care provider or pharmacist if you have questions. COMMON BRAND NAME(S): Lipitor What should I tell my health care provider before I take this medicine? They need to know if you have any of these conditions:  diabetes (high blood sugar)  if you often drink alcohol  kidney disease  liver disease  muscle cramps, pain  stroke  thyroid disease  an unusual or allergic reaction to atorvastatin, other medicines, foods, dyes, or preservatives  pregnant or trying to get pregnant  breast-feeding How should I use this medicine? Take this medicine by mouth. Take it as directed on the prescription label at the same time every day. You can take it with or without food. If it upsets your stomach, take it with  food. Keep taking it unless your health care provider tells you to stop. Do not take this medicine with grapefruit juice. Talk to your health care provider about the use of this medicine in children. While it may be prescribed for children as young as 10 for selected conditions, precautions do apply. Overdosage: If you think you have taken too much of this medicine contact a poison control center or emergency room at once. NOTE: This medicine is only for you. Do not share this medicine with others. What if I miss a dose? If you miss a dose, take it as soon as you can. If it is almost time for your next dose, take only that dose. Do not take double or extra doses. What may interact with this medicine? Do not take this medicine with any of the following medications:  dasabuvir; ombitasvir; paritaprevir; ritonavir  ombitasvir; paritaprevir; ritonavir  posaconazole  red yeast rice This medicine may also interact with the following medications:  alcohol  birth control pills  certain antibiotics like erythromycin and clarithromycin  certain antivirals for HIV or hepatitis  certain medicines for cholesterol like fenofibrate, gemfibrozil, and niacin  certain medicines for fungal infections like ketoconazole and itraconazole  colchicine  cyclosporine  digoxin  grapefruit juice  rifampin This list may not describe all possible interactions. Give your health care provider a list of all the medicines, herbs, non-prescription drugs, or dietary supplements you use. Also tell them if you smoke, drink alcohol, or use illegal drugs. Some items may interact with your medicine. What should I watch for while using this medicine? Visit your health care provider for regular checks on your progress. Tell your health care provider if your symptoms do not start to get better or if they get worse. Your health care provider may tell you to stop taking this medicine if you develop muscle problems. If  your muscle problems do not go away after stopping this medicine, contact your health care provider. Do not become pregnant while taking this medicine. Women should inform their health care provider if they wish to become pregnant or think they might be pregnant. There is potential for serious harm to an unborn child. Talk to your health care provider for more information. Do not breast-feed an infant while taking this medicine. Birth control may not work properly while you are taking this medicine. Talk to your health care provider about using an extra method of birth control. This  medicine may increase blood sugar. Ask your health care provider if changes in diet or medicines are needed if you have diabetes. If you are going to need surgery or other procedure, tell your health care provider that you are using this medicine. Taking this medicine is only part of a total heart healthy program. Your health care provider may give you a special diet to follow. Avoid alcohol. Avoid smoking. Ask your health care provider how much you should exercise. What side effects may I notice from receiving this medicine? Side effects that you should report to your doctor or health care provider as soon as possible:  allergic reactions (skin rash, itching or hives; swelling of the face, lips, or tongue)  high blood sugar (increased hunger, thirst or urination; unusually weak or tired, blurry vision)  infection (fever, chills, cough, sore throat, pain or trouble passing urine)  joint pain  liver injury (dark yellow or brown urine; general ill feeling or flu-like symptoms; loss of appetite, right upper belly pain; unusually weak or tired, yellowing of the eyes or skin)  muscle injury (dark urine; trouble passing urine or change in the amount of urine; unusually weak or tired; muscle pain; back pain)  redness, blistering, peeling, or loosening of the skin, including inside the mouth Side effects that usually do not  require medical attention (report to your doctor or health care provider if they continue or are bothersome):  diarrhea  nausea  upset stomach This list may not describe all possible side effects. Call your doctor for medical advice about side effects. You may report side effects to FDA at 1-800-FDA-1088. Where should I keep my medicine? Keep out of the reach of children and pets. Store at room temperature between 20 and 25 degrees C (68 and 77 degrees F). Get rid of any unused medicine after the expiration date. To get rid of medicines that are no longer needed or have expired:  Take the medicine to a medicine take-back program. Check with your pharmacy or law enforcement to find a location.  If you cannot return the medicine, check the label or package insert to see if the medicine should be thrown out in the garbage or flushed down the toilet. If you are not sure, ask your health care provider. If it is safe to put it in the trash, take the medicine out of the container. Mix the medicine with cat litter, dirt, coffee grounds, or other unwanted substance. Seal the mixture in a bag or container. Put it in the trash. NOTE: This sheet is a summary. It may not cover all possible information. If you have questions about this medicine, talk to your doctor, pharmacist, or health care provider.  2021 Elsevier/Gold Standard (2020-10-26 12:41:57)   Cardiac CT Angiogram A cardiac CT angiogram is a procedure to look at the heart and the area around the heart. It may be done to help find the cause of chest pains or other symptoms of heart disease. During this procedure, a substance called contrast dye is injected into the blood vessels in the area to be checked. A large X-ray machine, called a CT scanner, then takes detailed pictures of the heart and the surrounding area. The procedure is also sometimes called a coronary CT angiogram, coronary artery scanning, or CTA. A cardiac CT angiogram allows the  health care provider to see how well blood is flowing to and from the heart. The health care provider will be able to see if there are any problems,  such as:  Blockage or narrowing of the coronary arteries in the heart.  Fluid around the heart.  Signs of weakness or disease in the muscles, valves, and tissues of the heart. Tell a health care provider about:  Any allergies you have. This is especially important if you have had a previous allergic reaction to contrast dye.  All medicines you are taking, including vitamins, herbs, eye drops, creams, and over-the-counter medicines.  Any blood disorders you have.  Any surgeries you have had.  Any medical conditions you have.  Whether you are pregnant or may be pregnant.  Any anxiety disorders, chronic pain, or other conditions you have that may increase your stress or prevent you from lying still. What are the risks? Generally, this is a safe procedure. However, problems may occur, including:  Bleeding.  Infection.  Allergic reactions to medicines or dyes.  Damage to other structures or organs.  Kidney damage from the contrast dye that is used.  Increased risk of cancer from radiation exposure. This risk is low. Talk with your health care provider about: ? The risks and benefits of testing. ? How you can receive the lowest dose of radiation. What happens before the procedure?  Wear comfortable clothing and remove any jewelry, glasses, dentures, and hearing aids.  Follow instructions from your health care provider about eating and drinking. This may include: ? For 12 hours before the procedure -- avoid caffeine. This includes tea, coffee, soda, energy drinks, and diet pills. Drink plenty of water or other fluids that do not have caffeine in them. Being well hydrated can prevent complications. ? For 4-6 hours before the procedure -- stop eating and drinking. The contrast dye can cause nausea, but this is less likely if your  stomach is empty.  Ask your health care provider about changing or stopping your regular medicines. This is especially important if you are taking diabetes medicines, blood thinners, or medicines to treat problems with erections (erectile dysfunction). What happens during the procedure?  Hair on your chest may need to be removed so that small sticky patches called electrodes can be placed on your chest. These will transmit information that helps to monitor your heart during the procedure.  An IV will be inserted into one of your veins.  You might be given a medicine to control your heart rate during the procedure. This will help to ensure that good images are obtained.  You will be asked to lie on an exam table. This table will slide in and out of the CT machine during the procedure.  Contrast dye will be injected into the IV. You might feel warm, or you may get a metallic taste in your mouth.  You will be given a medicine called nitroglycerin. This will relax or dilate the arteries in your heart.  The table that you are lying on will move into the CT machine tunnel for the scan.  The person running the machine will give you instructions while the scans are being done. You may be asked to: ? Keep your arms above your head. ? Hold your breath. ? Stay very still, even if the table is moving.  When the scanning is complete, you will be moved out of the machine.  The IV will be removed. The procedure may vary among health care providers and hospitals.   What can I expect after the procedure? After your procedure, it is common to have:  A metallic taste in your mouth from the contrast dye.  A feeling of warmth.  A headache from the nitroglycerin. Follow these instructions at home:  Take over-the-counter and prescription medicines only as told by your health care provider.  If you are told, drink enough fluid to keep your urine pale yellow. This will help to flush the contrast dye  out of your body.  Most people can return to their normal activities right after the procedure. Ask your health care provider what activities are safe for you.  It is up to you to get the results of your procedure. Ask your health care provider, or the department that is doing the procedure, when your results will be ready.  Keep all follow-up visits as told by your health care provider. This is important. Contact a health care provider if:  You have any symptoms of allergy to the contrast dye. These include: ? Shortness of breath. ? Rash or hives. ? A racing heartbeat. Summary  A cardiac CT angiogram is a procedure to look at the heart and the area around the heart. It may be done to help find the cause of chest pains or other symptoms of heart disease.  During this procedure, a large X-ray machine, called a CT scanner, takes detailed pictures of the heart and the surrounding area after a contrast dye has been injected into blood vessels in the area.  Ask your health care provider about changing or stopping your regular medicines before the procedure. This is especially important if you are taking diabetes medicines, blood thinners, or medicines to treat erectile dysfunction.  If you are told, drink enough fluid to keep your urine pale yellow. This will help to flush the contrast dye out of your body. This information is not intended to replace advice given to you by your health care provider. Make sure you discuss any questions you have with your health care provider. Document Revised: 07/07/2019 Document Reviewed: 07/07/2019 Elsevier Patient Education  2021 [employer]

## 2021-04-13 ENCOUNTER — Telehealth (HOSPITAL_COMMUNITY): Payer: Self-pay | Admitting: *Deleted

## 2021-04-13 NOTE — Telephone Encounter (Signed)
Reaching out to patient to offer assistance regarding upcoming cardiac imaging study; pt verbalizes understanding of appt date/time, parking situation and where to check in, pre-test NPO status and medications ordered, and verified current allergies; name and call back number provided for further questions should they arise  Larey Brick RN Navigator Cardiac Imaging [facility] [phone] office [phone] cell  Pt to hold amlodipine and take 100mg  metoprolol tartrate 2 hours prior to cardiac CT scan.

## 2021-04-16 ENCOUNTER — Other Ambulatory Visit: Payer: Self-pay

## 2021-04-16 ENCOUNTER — Ambulatory Visit (HOSPITAL_COMMUNITY)
Admission: RE | Admit: 2021-04-16 | Discharge: 2021-04-16 | Disposition: A | Discharge status: Home / Self Care | Payer: Medicare Other | Source: Ambulatory Visit | Attending: Cardiology | Admitting: Cardiology

## 2021-04-16 DIAGNOSIS — R079 Chest pain, unspecified: Secondary | ICD-10-CM | POA: Insufficient documentation

## 2021-04-16 DIAGNOSIS — I700 Atherosclerosis of aorta: Secondary | ICD-10-CM | POA: Insufficient documentation

## 2021-04-16 MED ORDER — NITROGLYCERIN 0.4 MG SL SUBL
SUBLINGUAL_TABLET | SUBLINGUAL | Status: AC
  Filled 2021-04-16: qty 2

## 2021-04-16 MED ORDER — NITROGLYCERIN 0.4 MG SL SUBL
0.8000 mg | SUBLINGUAL_TABLET | Freq: Once | SUBLINGUAL | Status: AC
  Administered 2021-04-16: 0.8 mg via SUBLINGUAL

## 2021-04-16 MED ORDER — IOHEXOL 350 MG/ML SOLN
95.0000 mL | Freq: Once | INTRAVENOUS | Status: AC | PRN
  Administered 2021-04-16: 95 mL via INTRAVENOUS

## 2021-05-17 ENCOUNTER — Ambulatory Visit: Payer: Medicare Other | Admitting: Cardiology

## 2021-07-03 ENCOUNTER — Ambulatory Visit: Payer: Medicare Other | Admitting: Cardiology

## 2021-09-26 ENCOUNTER — Other Ambulatory Visit: Payer: Self-pay | Admitting: Cardiology

## 2022-02-24 ENCOUNTER — Encounter (HOSPITAL_BASED_OUTPATIENT_CLINIC_OR_DEPARTMENT_OTHER): Payer: Self-pay | Admitting: Emergency Medicine

## 2022-02-24 ENCOUNTER — Emergency Department (HOSPITAL_BASED_OUTPATIENT_CLINIC_OR_DEPARTMENT_OTHER)
Admission: EM | Admit: 2022-02-24 | Discharge: 2022-02-24 | Disposition: A | Discharge status: Home / Self Care | Payer: Medicare Other | Attending: Emergency Medicine | Admitting: Emergency Medicine

## 2022-02-24 ENCOUNTER — Other Ambulatory Visit: Payer: Self-pay

## 2022-02-24 ENCOUNTER — Emergency Department (HOSPITAL_BASED_OUTPATIENT_CLINIC_OR_DEPARTMENT_OTHER): Payer: Medicare Other

## 2022-02-24 DIAGNOSIS — I10 Essential (primary) hypertension: Secondary | ICD-10-CM | POA: Diagnosis not present

## 2022-02-24 DIAGNOSIS — X501XXA Overexertion from prolonged static or awkward postures, initial encounter: Secondary | ICD-10-CM | POA: Insufficient documentation

## 2022-02-24 DIAGNOSIS — S82832A Other fracture of upper and lower end of left fibula, initial encounter for closed fracture: Secondary | ICD-10-CM | POA: Diagnosis not present

## 2022-02-24 DIAGNOSIS — S8992XA Unspecified injury of left lower leg, initial encounter: Secondary | ICD-10-CM | POA: Diagnosis present

## 2022-02-24 DIAGNOSIS — Z79899 Other long term (current) drug therapy: Secondary | ICD-10-CM | POA: Diagnosis not present

## 2022-02-24 DIAGNOSIS — W19XXXA Unspecified fall, initial encounter: Secondary | ICD-10-CM

## 2022-02-24 DIAGNOSIS — Y93K1 Activity, walking an animal: Secondary | ICD-10-CM | POA: Insufficient documentation

## 2022-02-24 DIAGNOSIS — M25572 Pain in left ankle and joints of left foot: Secondary | ICD-10-CM | POA: Diagnosis not present

## 2022-02-24 NOTE — ED Triage Notes (Signed)
Pt arrives pov to triage in Wheelchair, endorses mechanical fall yesterday when walking dog. Endorses twisting left ankle. Decreased ROM, with decreased wt bearing. Felt "pop". Denies thinners, no head trauma. Swelling noted. Cap refill <3 ?

## 2022-02-24 NOTE — Discharge Instructions (Addendum)
?  You have a broken bone in your ankle.  I recommended crutches and a cast but did not feel that you are able to get around with this.  Therefore we put you in a boot instead.  Try to keep off of your left foot is much as possible over the next 2 weeks.  If you do need to walk, walk gently with toe and heel touches with the boot, trying to avoid bearing full weight on the left leg.  You can use a walker for additional stability.  You can take the boot off when you are showering and in bed at night. ? ?You should call to set up a follow-up appointment with an orthopedic doctor in 2 weeks in the clinic.  You can call the number above for orthopedic doctor, or use your own. ? ?You can continue using your own medications including Tylenol as needed for pain at home.  You should also put the ankle up on a couch (elevate) and can apply ice or frozen peas for 10 minutes at a time to the side of the ankle as needed for pain and swelling. ?

## 2022-02-24 NOTE — ED Provider Notes (Signed)
?[facility] EMERGENCY DEPARTMENT ?Provider Note ? ? ?CSN: [id number] ?Arrival date & time: 02/24/22  1230 ? ?  ? ?History ? ?Chief Complaint  ?Patient presents with  ? Fall  ? ? ?Christie Day is a 76 y.o. female presented to the ER with fall and ankle injury.  She reports that she fell and twisted her left ankle on uneven grass yesterday evening while walking her dog.  She was able to walk on it afterwards.  She may have felt a pop in the ankle.  No other injuries reported ? ?HPI ? ?  ? ?Home Medications ?Prior to Admission medications   ?Medication Sig Start Date End Date Taking? Authorizing Provider  ?amLODipine (NORVASC) 5 MG tablet Take 5 mg by mouth daily.    [provider]  ?atorvastatin (LIPITOR) 10 MG tablet Take 1 tablet by mouth once daily 09/26/21   Georgeanna Lea, MD  ?B Complex-C (SUPER B COMPLEX PO) Take 1 tablet by mouth daily. Unknown strength    [provider]  ?Calcium Acetate, Phos Binder, (CALCIUM ACETATE PO) Take 1 tablet by mouth daily. Unknown strength    [provider]  ?cholecalciferol (VITAMIN D3) 25 MCG (1000 UNIT) tablet Take 1,000 Units by mouth daily.    [provider]  ?DULoxetine (CYMBALTA) 30 MG capsule Take 60 mg by mouth daily. 04/07/17   [provider]  ?fexofenadine-pseudoephedrine (ALLEGRA-D) 60-120 MG 12 hr tablet Take 1 tablet by mouth as needed (Seasonal allergies).    [provider]  ?metoprolol tartrate (LOPRESSOR) 100 MG tablet Take 1 tablet (100 mg total) by mouth once for 1 dose. 2 hours before ct. 03/23/21 03/23/21  Georgeanna Lea, MD  ?Multiple Vitamin (MULTI-VITAMIN) tablet Take 1 tablet by mouth daily. Unknown strength    [provider]  ?naproxen (NAPROSYN) 500 MG tablet Take 1 tablet by mouth as needed (feet arthritis, neuropathy). 01/23/21   [provider]  ?nitroGLYCERIN (NITROSTAT) 0.4 MG SL tablet Place 1 tablet (0.4 mg total) under the tongue every 5 (five) minutes  as needed. 03/23/21 06/21/21  Georgeanna Lea, MD  ?oxybutynin (DITROPAN-XL) 5 MG 24 hr tablet Take 1 tablet by mouth at bedtime. 12/14/20   [provider]  ?pyridoxine (B-6) 100 MG tablet Take 100 mg by mouth daily.    [provider]  ?Turmeric (QC TUMERIC COMPLEX PO) Take 1 tablet by mouth daily. Unknown strength    [provider]  ?   ? ?Allergies    ?Lisinopril and Sulfa antibiotics   ? ?Review of Systems   ?Review of Systems ? ?Physical Exam ?Updated Vital Signs ?BP (!) 154/70 (BP Location: Right Arm)   Pulse 66   Temp 98.3 ?F (36.8 ?C) (Oral)   Resp 18   Ht 5\' 6"  (1.676 m)   Wt 68 kg   SpO2 99%   BMI 24.21 kg/m?  ?Physical Exam ?Constitutional:   ?   General: She is not in acute distress. ?HENT:  ?   Head: Normocephalic and atraumatic.  ?Cardiovascular:  ?   Rate and Rhythm: Normal rate and regular rhythm.  ?   Pulses: Normal pulses.  ?Musculoskeletal:  ?   Comments: Anterior talofibular ligament of the left ankle, mild swelling along the ligament, no posterior medial or lateral malleoli or tenderness, no tenderness of the base of the fifth metatarsal around the midfoot.  Patient is able to ambulate in the ED.  No other injuries noted  ?Skin: ?  General: Skin is warm and dry.  ?Neurological:  ?   Mental Status: She is alert and oriented to person, place, and time. Mental status is at baseline.  ?Psychiatric:     ?   Mood and Affect: Mood normal.     ?   Behavior: Behavior normal.  ? ? ?ED Results / Procedures / Treatments   ?Labs ?(all labs ordered are listed, but only abnormal results are displayed) ?Labs Reviewed - No data to display ? ?EKG ?None ? ?Radiology ?DG Ankle Complete Left ? ?Result Date: 02/24/2022 ?CLINICAL DATA:  Fall, inversion injury to ankle.a EXAM: LEFT ANKLE COMPLETE - 3+ VIEW COMPARISON:  None. FINDINGS: Cortical irregularity of the distal fibula consistent with mildly displaced fracture at the level of the syndesmotic ligament. Ankle mortise is  congruent. No other appreciable fracture. Soft tissue swelling about the lateral malleolus as expected. IMPRESSION: Mildly displaced fracture of the distal fibula. Electronically Signed   By: Larose Hires D.O.   On: 02/24/2022 13:18   ? ?Procedures ?Procedures  ? ? ?Medications Ordered in ED ?Medications - No data to display ? ?ED Course/ Medical Decision Making/ A&P ?Clinical Course as of 02/24/22 1343  ?Wynelle Link Feb 24, 2022  ?1338 Patient is a fibula fracture.  Unfortunately she feels very strongly she is not able to manage crutches at home, and therefore cast may not be an amenable option.  We will place her instead in a cam boot for stability, advise very light toe touches, she has a walker at home for some extra stability.  She will need to follow-up with an orthopedic doctor.  They verbalized understanding. [MT]  ?  ?Clinical Course User Index ?[MT] Terald Sleeper, MD  ? ?                        ?Medical Decision Making ?Amount and/or Complexity of Data Reviewed ?Radiology: ordered. ? ? ?Patient is here with mechanical fall and ankle injury.  X-rays ordered and personally reviewed showing nondisplaced fibular fracture. ? ? Cam boot was offered for comfort and stability at home, advised ice, compression, Motrin, elevation.  Patient verbalized understanding. ? ? ? ? ? ? ? ? ? ?Final Clinical Impression(s) / ED Diagnoses ?Final diagnoses:  ?Fall, initial encounter  ?Closed fracture of distal end of left fibula, unspecified fracture morphology, initial encounter  ? ? ?Rx / DC Orders ?ED Discharge Orders   ? ? None  ? ?  ? ? ?  ?Terald Sleeper, MD ?02/24/22 1344 ? ?

## 2022-09-13 ENCOUNTER — Emergency Department (HOSPITAL_BASED_OUTPATIENT_CLINIC_OR_DEPARTMENT_OTHER): Payer: Medicare Other

## 2022-09-13 ENCOUNTER — Emergency Department (HOSPITAL_BASED_OUTPATIENT_CLINIC_OR_DEPARTMENT_OTHER)
Admission: EM | Admit: 2022-09-13 | Discharge: 2022-09-13 | Discharge status: Against Medical Advice | Payer: Medicare Other | Attending: Emergency Medicine | Admitting: Emergency Medicine

## 2022-09-13 ENCOUNTER — Other Ambulatory Visit: Payer: Self-pay

## 2022-09-13 ENCOUNTER — Encounter (HOSPITAL_BASED_OUTPATIENT_CLINIC_OR_DEPARTMENT_OTHER): Payer: Self-pay | Admitting: Emergency Medicine

## 2022-09-13 DIAGNOSIS — R5383 Other fatigue: Secondary | ICD-10-CM | POA: Diagnosis not present

## 2022-09-13 DIAGNOSIS — R059 Cough, unspecified: Secondary | ICD-10-CM | POA: Insufficient documentation

## 2022-09-13 DIAGNOSIS — R0981 Nasal congestion: Secondary | ICD-10-CM | POA: Diagnosis not present

## 2022-09-13 DIAGNOSIS — Z5321 Procedure and treatment not carried out due to patient leaving prior to being seen by health care provider: Secondary | ICD-10-CM | POA: Diagnosis not present

## 2022-09-13 DIAGNOSIS — Z20822 Contact with and (suspected) exposure to covid-19: Secondary | ICD-10-CM | POA: Insufficient documentation

## 2022-09-13 LAB — RESP PANEL BY RT-PCR (FLU A&B, COVID) ARPGX2
Influenza A by PCR: NEGATIVE
Influenza B by PCR: NEGATIVE
SARS Coronavirus 2 by RT PCR: NEGATIVE

## 2022-09-13 NOTE — ED Notes (Signed)
NO lab draw were done prior to pt leaving before being seen by provider.

## 2022-09-13 NOTE — ED Triage Notes (Signed)
Pt arrives pov, steady gait, c/o cough, congestion with fatigue x 5 days.Also endorses decreased taste, denies shob normal PO intake

## 2022-10-03 ENCOUNTER — Other Ambulatory Visit (HOSPITAL_BASED_OUTPATIENT_CLINIC_OR_DEPARTMENT_OTHER): Payer: Self-pay

## 2022-10-03 MED ORDER — COMIRNATY 30 MCG/0.3ML IM SUSY
PREFILLED_SYRINGE | INTRAMUSCULAR | 0 refills | Status: AC
  Filled 2022-10-03: qty 0.3, 1d supply, fill #0

## 2022-12-02 ENCOUNTER — Ambulatory Visit: Payer: Medicare Other | Admitting: Obstetrics and Gynecology

## 2022-12-10 ENCOUNTER — Other Ambulatory Visit (HOSPITAL_BASED_OUTPATIENT_CLINIC_OR_DEPARTMENT_OTHER): Payer: Self-pay

## 2022-12-10 MED ORDER — AREXVY 120 MCG/0.5ML IM SUSR
INTRAMUSCULAR | 0 refills | Status: AC
  Filled 2022-12-10: qty 0.5, 1d supply, fill #0

## 2023-01-13 ENCOUNTER — Encounter: Payer: Self-pay | Admitting: *Deleted

## 2023-02-20 IMAGING — CT CT HEART MORP W/ CTA COR W/ SCORE W/ CA W/CM &/OR W/O CM
4 of 7 series · 8 of 20 positions shown, 9 images · IV contrast (APPLIED)
Comparison: None.
COMPARISON: None.

Addendum:
EXAM:
OVER-READ INTERPRETATION  CT CHEST

The following report is an over-read performed by radiologist Dr.
Gumaro Sumoza [[email or web address]] on 04/16/2021. This
over-read does not include interpretation of cardiac or coronary
anatomy or pathology. The coronary calcium score/coronary CTA
interpretation by the cardiologist is attached.
TECHNIQUE: The patient was scanned on a [employer] Force scanner.

[Series 6: best diast 70 % · axial · 0.39mm/px · z∈[+1180,+1222]mm · 2 of 316 slices shown, 3 images]
[im 106/316  vessel]
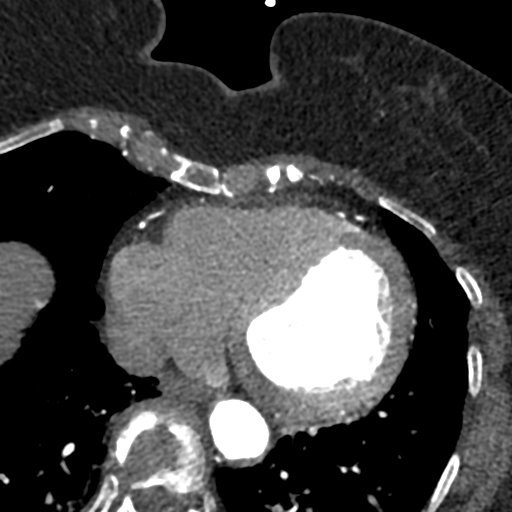
[im 106/316  lung]
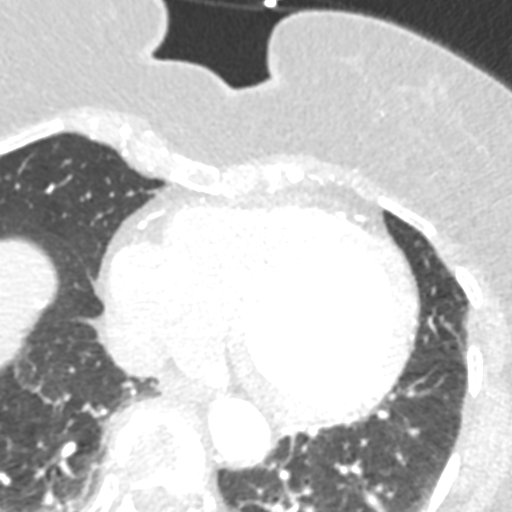
[im 211/316  vessel]
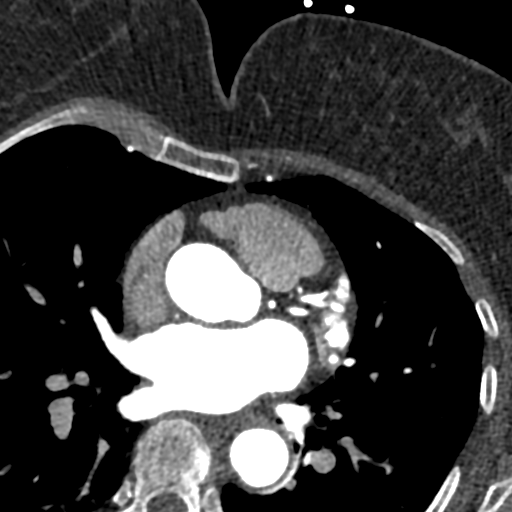

[Series 7: best syst 33 % · axial · 0.39mm/px · z∈[+1180,+1222]mm · 2 of 316 slices shown]
[im 106/316  vessel]
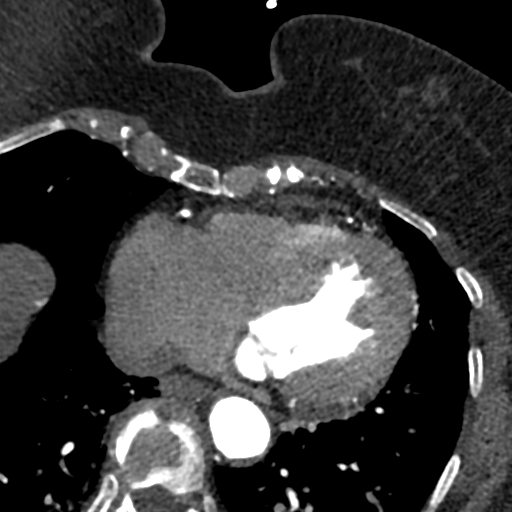
[im 211/316  vessel]
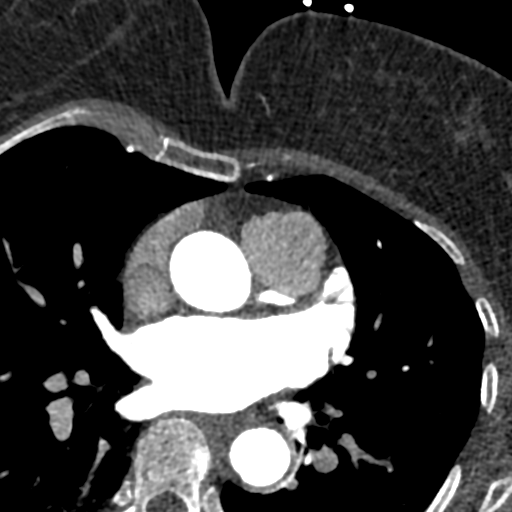

[Series 8: ts diast sharp 70 % · axial · 0.39mm/px · z∈[+1180,+1222]mm · 2 of 316 slices shown]
[im 106/316  lung]
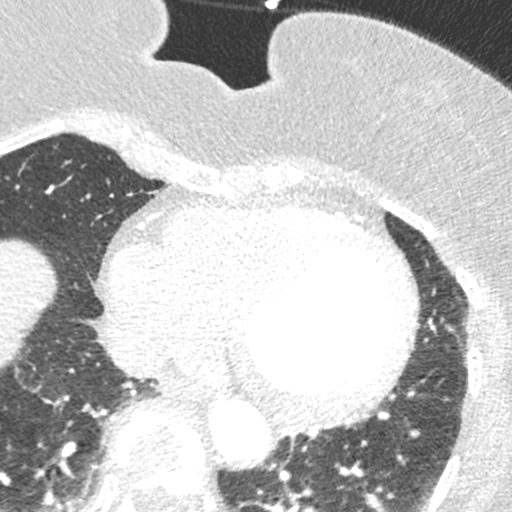
[im 211/316  lung]
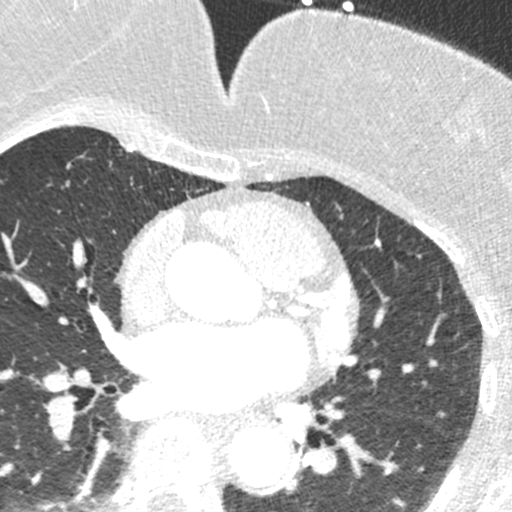

[Series 9: ts syst sharp 33 % · axial · 0.39mm/px · z∈[+1180,+1222]mm · 2 of 316 slices shown]
[im 106/316  lung]
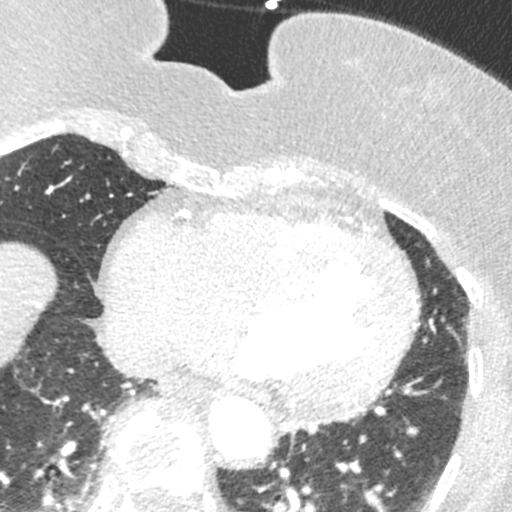
[im 211/316  lung]
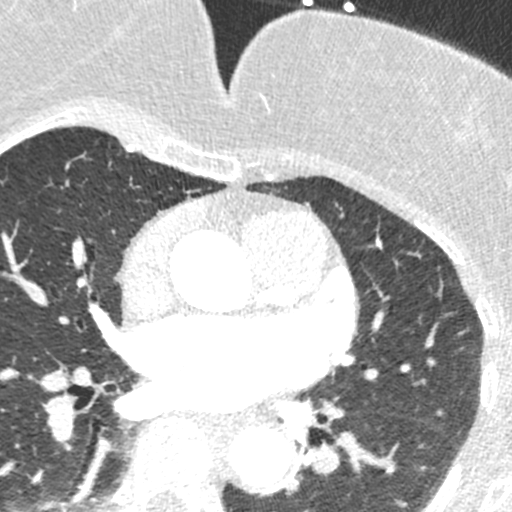

[8 of 20 positions shown; findings below may reference images not displayed]

FINDINGS: Atherosclerotic calcifications in the thoracic aorta within the
visualized portions of the thorax there are no suspicious appearing
pulmonary nodules or masses, there is no acute consolidative
airspace disease, no pleural effusions, no pneumothorax and no
lymphadenopathy. Visualized portions of the upper abdomen are
unremarkable. There are no aggressive appearing lytic or blastic
lesions noted in the visualized portions of the skeleton.
IMPRESSION: 1.  Aortic Atherosclerosis (IR82Z-O5T.T).

EXAM:
Cardiac/Coronary  CT
FINDINGS: A 120 kV prospective scan was triggered in the descending thoracic
aorta at 111 HU's. Axial non-contrast 3 mm slices were carried out
through the heart. The data set was analyzed on a dedicated work
station and scored using the Agatson method. Gantry rotation speed
was 250 msecs and collimation was .6 mm. No beta blockade and 0.8 mg
of sl NTG was given. The 3D data set was reconstructed in 5%
intervals of the 67-82 % of the R-R cycle. Diastolic phases were
analyzed on a dedicated work station using MPR, MIP and VRT modes.
The patient received 80 cc of contrast.

Aorta: Normal size. Scattered calcifications in the descending
aorta. No dissection.

Aortic Valve:  Trileaflet.  No calcifications.

Coronary Arteries:  Normal coronary origin.  Right dominance.

RCA is a moderate non dominant artery. There is breathing artifact
noted in the proximal RCA but overall no obvious plaque.

Left main is a large artery that gives rise to LAD ,Ramus and LCX
arteries. There is no plaque.

LAD is a large vessel that gives rise to a moderate sized Diagonal.
There is no plaque.

Ramus is a large branching vessel with no plaque.

LCX is a non-dominant artery that gives rise to one moderate OM1
branch. There is no plaque.

Other findings:

Normal pulmonary vein drainage into the left atrium.

Normal let atrial appendage without a thrombus.

Normal size of the pulmonary artery.
IMPRESSION: 1. Coronary calcium score of 0. This was 0 percentile for age and
sex matched control.

2.  Normal coronary origin with right dominance.

3.  No evidence of CAD.  CAD RADs 0

4.  Consider non atherosclerotic causes of chest pain.

Kintziger Danelutti

*** End of Addendum ***
EXAM:
OVER-READ INTERPRETATION  CT CHEST

The following report is an over-read performed by radiologist Dr.
Gumaro Sumoza [[email or web address]] on 04/16/2021. This
over-read does not include interpretation of cardiac or coronary
anatomy or pathology. The coronary calcium score/coronary CTA
interpretation by the cardiologist is attached.
FINDINGS: Atherosclerotic calcifications in the thoracic aorta within the
visualized portions of the thorax there are no suspicious appearing
pulmonary nodules or masses, there is no acute consolidative
airspace disease, no pleural effusions, no pneumothorax and no
lymphadenopathy. Visualized portions of the upper abdomen are
unremarkable. There are no aggressive appearing lytic or blastic
lesions noted in the visualized portions of the skeleton.
IMPRESSION: 1.  Aortic Atherosclerosis (IR82Z-O5T.T).

## 2023-12-31 IMAGING — DX DG ANKLE COMPLETE 3+V*L*
3 series · 3 of 3 positions shown · non-contrast
Comparison: None.

CLINICAL DATA: Fall, inversion injury to ankle.a

EXAM:
LEFT ANKLE COMPLETE - 3+ VIEW

[ankle ap]
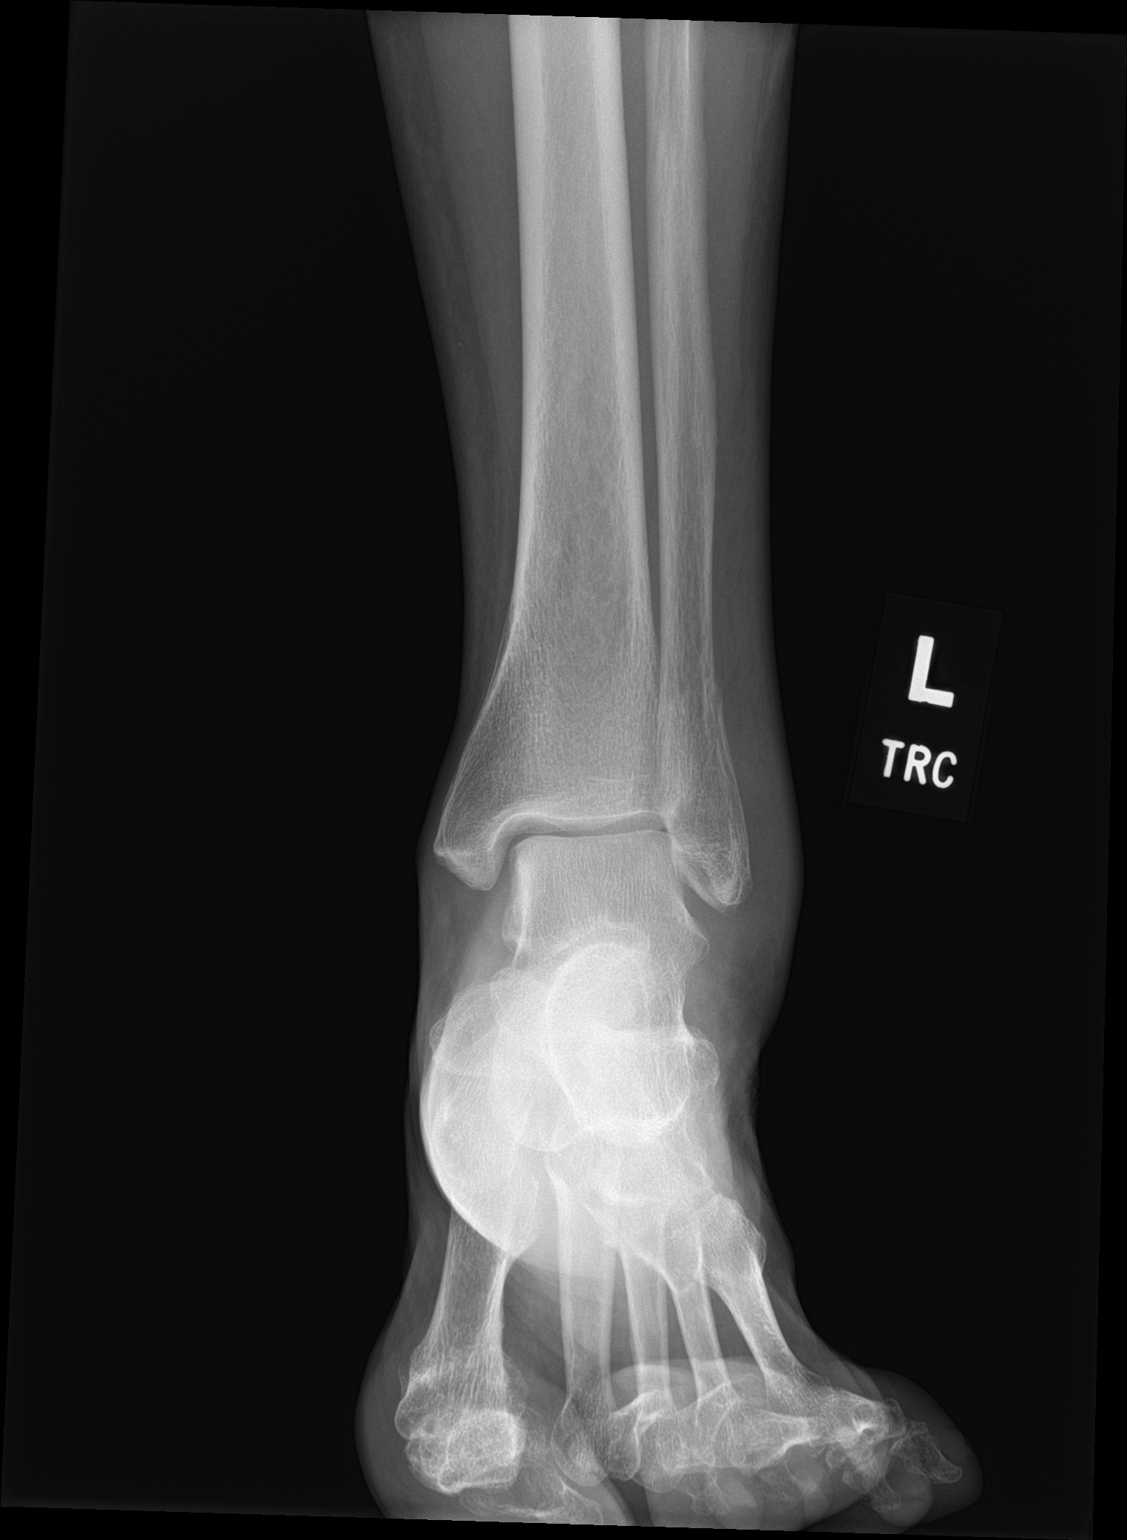

[ankle obl]
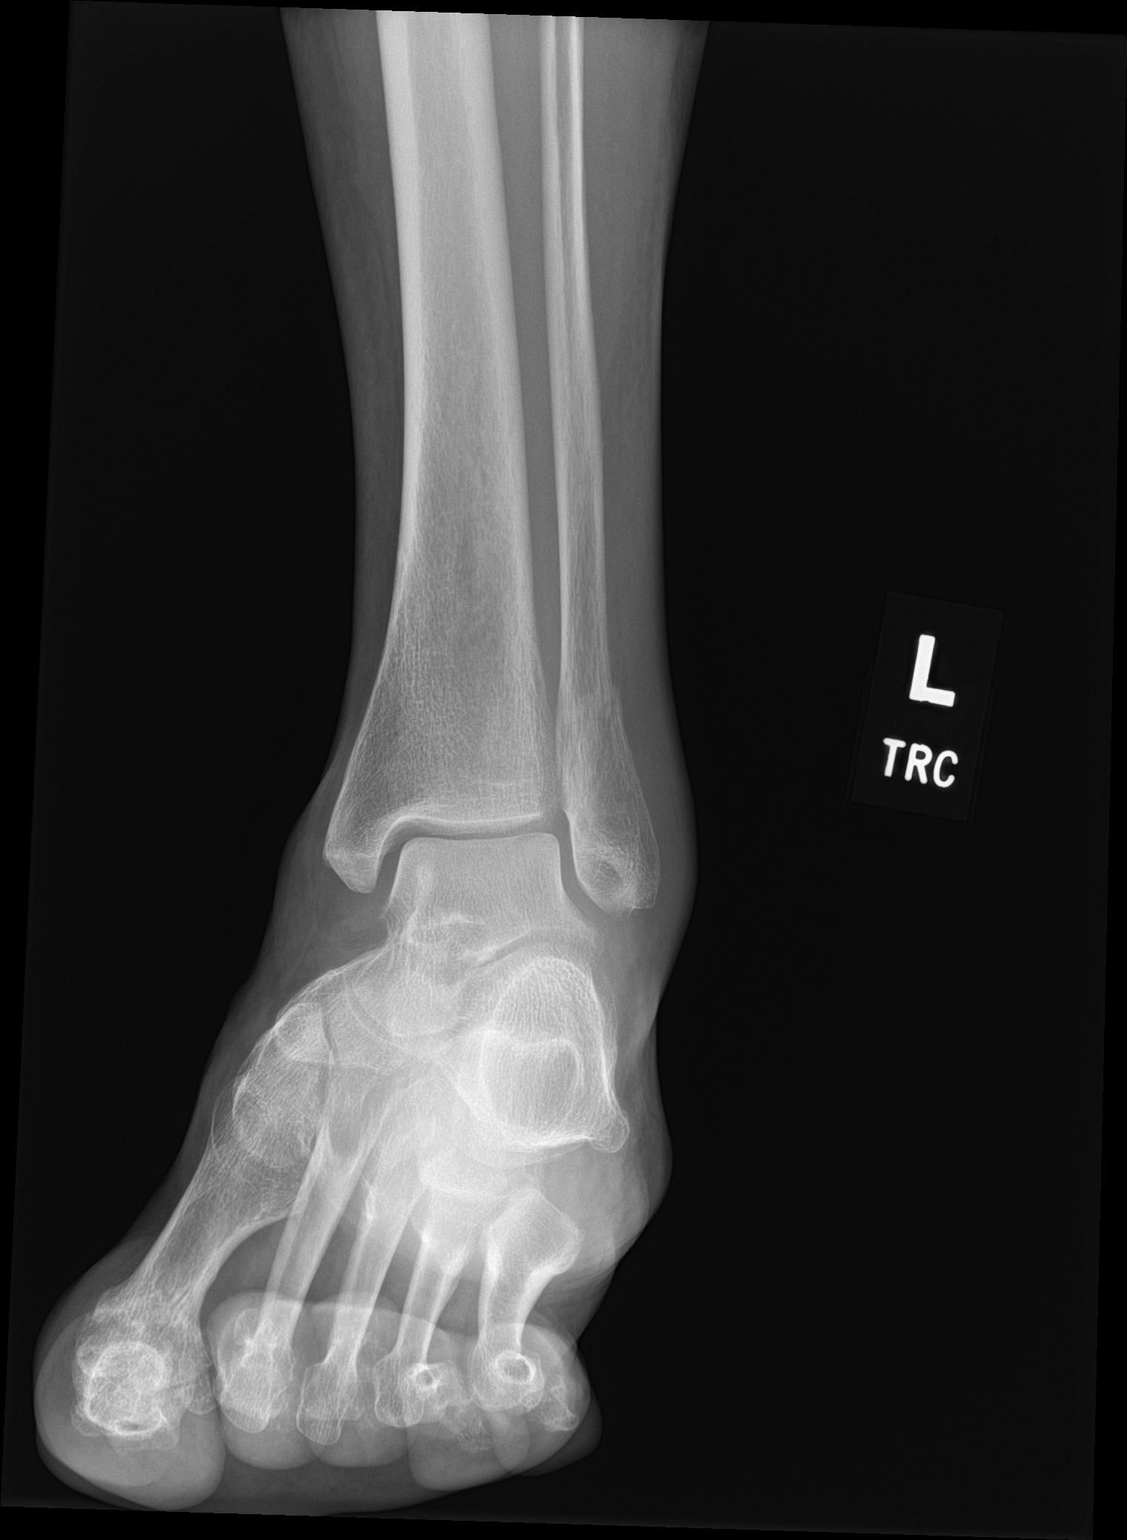

[ankle lat]
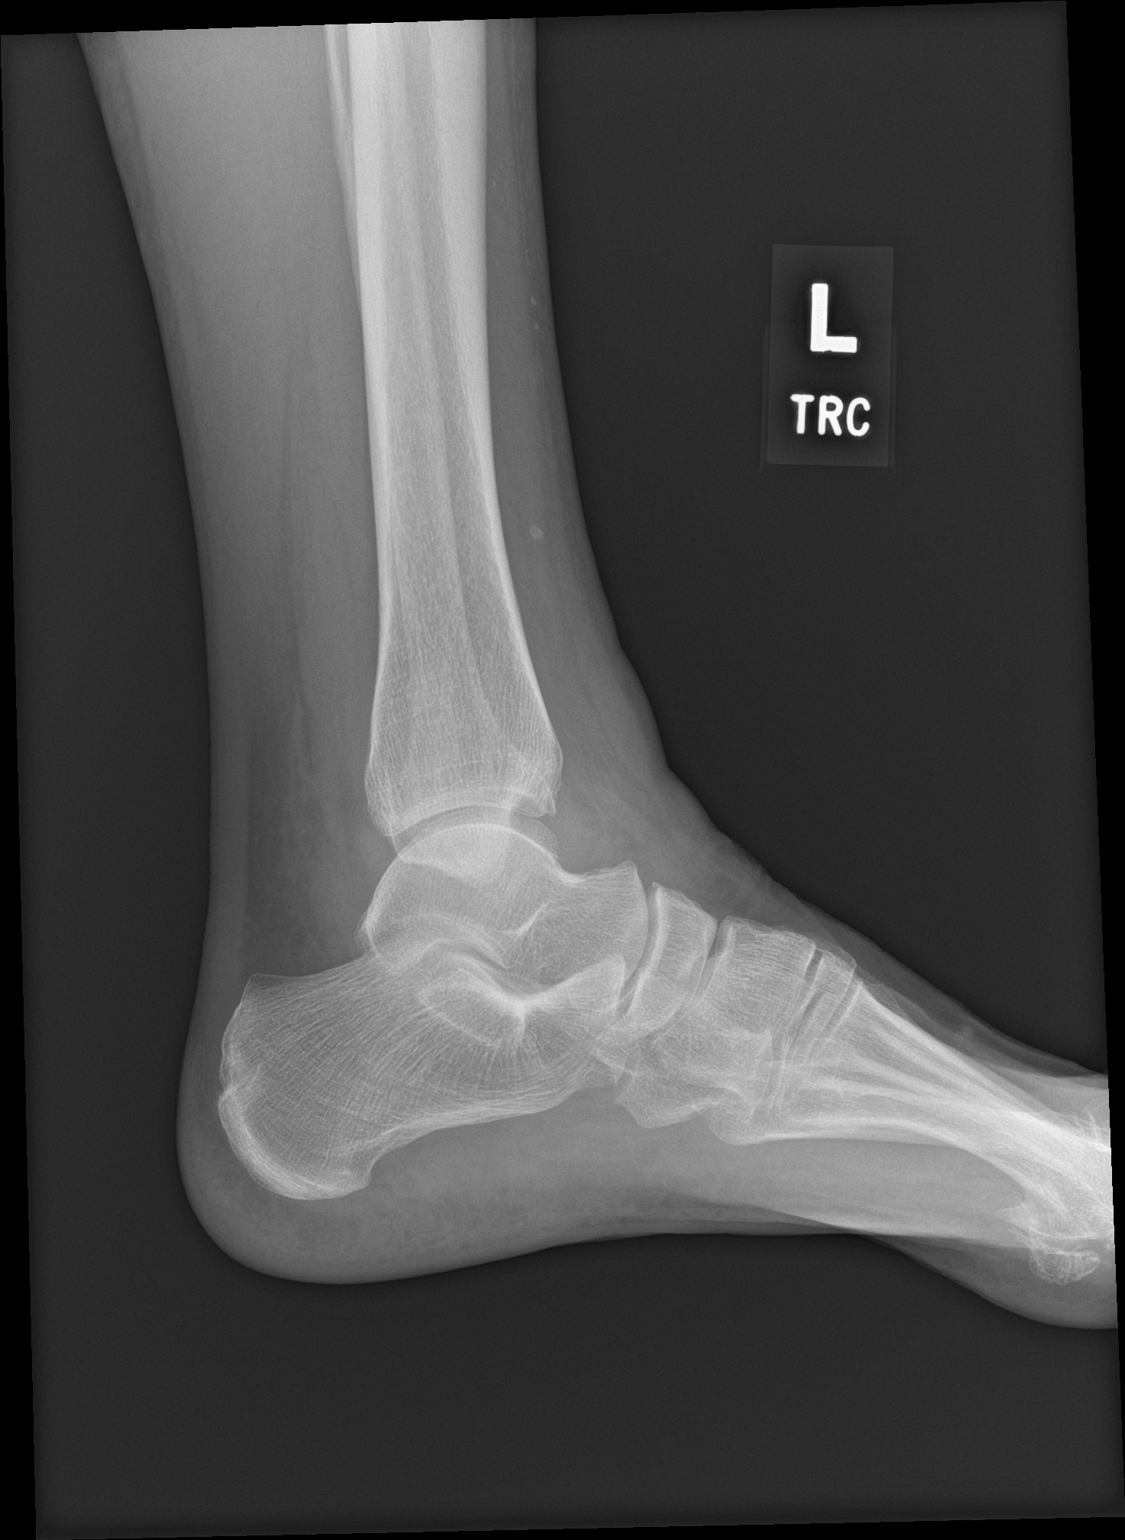

[3 of 3 positions shown; findings below may reference images not displayed]

FINDINGS: Cortical irregularity of the distal fibula consistent with mildly
displaced fracture at the level of the syndesmotic ligament. Ankle
mortise is congruent. No other appreciable fracture. Soft tissue
swelling about the lateral malleolus as expected.
IMPRESSION: Mildly displaced fracture of the distal fibula.

## 2024-05-08 ENCOUNTER — Other Ambulatory Visit: Payer: Self-pay

## 2024-05-08 ENCOUNTER — Encounter (HOSPITAL_BASED_OUTPATIENT_CLINIC_OR_DEPARTMENT_OTHER): Payer: Self-pay | Admitting: Emergency Medicine

## 2024-05-08 ENCOUNTER — Emergency Department (HOSPITAL_BASED_OUTPATIENT_CLINIC_OR_DEPARTMENT_OTHER)

## 2024-05-08 ENCOUNTER — Emergency Department (HOSPITAL_BASED_OUTPATIENT_CLINIC_OR_DEPARTMENT_OTHER)
Admission: EM | Admit: 2024-05-08 | Discharge: 2024-05-08 | Disposition: A | Discharge status: Home / Self Care | Attending: Emergency Medicine | Admitting: Emergency Medicine

## 2024-05-08 DIAGNOSIS — M25562 Pain in left knee: Secondary | ICD-10-CM | POA: Diagnosis present

## 2024-05-08 DIAGNOSIS — M25462 Effusion, left knee: Secondary | ICD-10-CM

## 2024-05-08 DIAGNOSIS — M1712 Unilateral primary osteoarthritis, left knee: Secondary | ICD-10-CM

## 2024-05-08 DIAGNOSIS — W108XXA Fall (on) (from) other stairs and steps, initial encounter: Secondary | ICD-10-CM | POA: Insufficient documentation

## 2024-05-08 NOTE — ED Triage Notes (Signed)
 Pt sts she fell while in [place] on [place]; c/o LT knee pain

## 2024-05-08 NOTE — ED Provider Notes (Signed)
 [facility] Provider Note   CSN: [id number] Arrival date & time: 05/08/24  1745     Patient presents with: Knee Pain   Christie Day is a 78 y.o. female presents emergency department chief complaint of left knee pain.  Patient was in [place] 10 days ago and missed a step fell twisted her knee.  She did ambulate on the knee throughout her visit but had she has been having progressively worsening pain.  She has been taking ibuprofen and ambulating however today her pain became so significant here that she was unable to bear weight on the knee or go up and down stairs.    Knee Pain      Prior to Admission medications   Medication Sig Start Date End Date Taking? Authorizing Provider  amLODipine (NORVASC) 5 MG tablet Take 5 mg by mouth daily.    [provider]  atorvastatin  (LIPITOR) 10 MG tablet Take 1 tablet by mouth once daily 09/26/21   Krasowski, Robert J, MD  B Complex-C (SUPER B COMPLEX PO) Take 1 tablet by mouth daily. Unknown strength    [provider]  Calcium  Acetate, Phos Binder, (CALCIUM  ACETATE PO) Take 1 tablet by mouth daily. Unknown strength    [provider]  cholecalciferol (VITAMIN D3) 25 MCG (1000 UNIT) tablet Take 1,000 Units by mouth daily.    [provider]  COVID-19 mRNA vaccine [phone] (COMIRNATY ) syringe Inject into the muscle. 10/03/22   Liane Redman, MD  DULoxetine (CYMBALTA) 30 MG capsule Take 60 mg by mouth daily. 04/07/17   [provider]  fexofenadine-pseudoephedrine (ALLEGRA-D) 60-120 MG 12 hr tablet Take 1 tablet by mouth as needed (Seasonal allergies).    [provider]  metoprolol  tartrate (LOPRESSOR ) 100 MG tablet Take 1 tablet (100 mg total) by mouth once for 1 dose. 2 hours before ct. 03/23/21 03/23/21  Krasowski, Robert J, MD  Multiple Vitamin (MULTI-VITAMIN) tablet Take 1 tablet by mouth daily. Unknown strength    [provider]  naproxen   (NAPROSYN ) 500 MG tablet Take 1 tablet by mouth as needed (feet arthritis, neuropathy). 01/23/21   [provider]  nitroGLYCERIN  (NITROSTAT ) 0.4 MG SL tablet Place 1 tablet (0.4 mg total) under the tongue every 5 (five) minutes as needed. 03/23/21 06/21/21  Krasowski, Robert J, MD  oxybutynin (DITROPAN-XL) 5 MG 24 hr tablet Take 1 tablet by mouth at bedtime. 12/14/20   [provider]  pyridoxine (B-6) 100 MG tablet Take 100 mg by mouth daily.    [provider]  RSV vaccine recomb adjuvanted (AREXVY ) 120 MCG/0.5ML injection Inject into the muscle. 12/10/22   Liane Redman, MD  Turmeric (QC TUMERIC COMPLEX PO) Take 1 tablet by mouth daily. Unknown strength    [provider]    Allergies: Lisinopril and Sulfa antibiotics    Review of Systems  Updated Vital Signs BP (!) 153/66 (BP Location: Left Arm)   Pulse 66   Temp 97.9 F (36.6 C) (Oral)   Resp (!) 22   Ht 5' 6 (1.676 m)   Wt 68 kg   SpO2 96%   BMI 24.21 kg/m   Physical Exam Vitals and nursing note reviewed.  Constitutional:      General: She is not in acute distress.    Appearance: She is well-developed. She is not diaphoretic.  HENT:     Head: Normocephalic and atraumatic.     Right Ear: External ear normal.     Left  Ear: External ear normal.     Nose: Nose normal.     Mouth/Throat:     Mouth: Mucous membranes are moist.   Eyes:     General: No scleral icterus.    Conjunctiva/sclera: Conjunctivae normal.    Cardiovascular:     Rate and Rhythm: Normal rate and regular rhythm.     Heart sounds: Normal heart sounds. No murmur heard.    No friction rub. No gallop.  Pulmonary:     Effort: Pulmonary effort is normal. No respiratory distress.     Breath sounds: Normal breath sounds.  Abdominal:     General: Bowel sounds are normal. There is no distension.     Palpations: Abdomen is soft. There is no mass.     Tenderness: There is no abdominal tenderness. There is no guarding.    Musculoskeletal:     Cervical back: Normal range of motion.     Comments: Full range of motion of the knee without significant pain.  Positive for medial joint line tenderness without obvious effusion or ligamentous instability.  Normal ipsilateral hip and ankle examination.   Skin:    General: Skin is warm and dry.   Neurological:     Mental Status: She is alert and oriented to person, place, and time.   Psychiatric:        Behavior: Behavior normal.     (all labs ordered are listed, but only abnormal results are displayed) Labs Reviewed - No data to display  EKG: None  Radiology: No results found.   Procedures   Medications Ordered in the ED - No data to display                                  Medical Decision Making Amount and/or Complexity of Data Reviewed Radiology: ordered.   78 year old female who presents emergency department chief complaint of left knee pain after injury almost 10 days ago.  Patient having progressively worsening pain and inability to walk.  Differential diagnosis includes internal derangement fracture effusion gout pseudogout sprain.  Referred pain from the hip.  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  Patient has been taking Mobic I have asked that she add on Tylenol  she has a walker at home will place her in a a hinged knee brace and patient will follow-up with orthopedics in the outpatient setting.  She appears otherwise appropriate for discharge at this time without evidence of acute fracture or other severe finding.     Final diagnoses:  None    ED Discharge Orders     None          Tama Fails, PA-C 05/08/24 2008    Floyd, Dan, DO 05/08/24 2200

## 2024-05-08 NOTE — Discharge Instructions (Signed)
 Contact a health care provider if: You have a fever or chills. You have swelling or redness in your knee that gets worse or doesn't get better. Your pain doesn't get better with medicine. Your toes tingle or feel numb. Get help right away if: Your leg turns pale, cool, or blue.

## 2024-05-08 NOTE — ED Notes (Signed)
 Discharge paperwork reviewed entirely with patient, including follow up care. Pain was under control. No prescriptions were called in, but all questions were addressed.  Pt verbalized understanding as well as all parties involved. No questions or concerns voiced at the time of discharge. No acute distress noted. Pt was encouraged to stay adequately hydrated and eat a healthy diet.   Pt was wheeled out to the PVA in a wheelchair without incident.  Pt advised they will seek followup care with a specialist and followup with their PCP.   The pt was instructed to set up and/or review MyChart for their results; and was informed their Providers all have access to the information as well.

## 2024-05-27 ENCOUNTER — Other Ambulatory Visit (HOSPITAL_COMMUNITY): Payer: Self-pay
